# Patient Record
Sex: Female | Born: 1970 | Hispanic: No | Marital: Married | Smoking: Never smoker
Health system: Southern US, Community
[De-identification: ages and names within clinical notes are randomized; demographics above are authoritative.]

## PROBLEM LIST (undated history)

## (undated) DIAGNOSIS — N941 Unspecified dyspareunia: Secondary | ICD-10-CM

## (undated) DIAGNOSIS — N736 Female pelvic peritoneal adhesions (postinfective): Secondary | ICD-10-CM

## (undated) DIAGNOSIS — D649 Anemia, unspecified: Secondary | ICD-10-CM

## (undated) DIAGNOSIS — E669 Obesity, unspecified: Secondary | ICD-10-CM

## (undated) DIAGNOSIS — E28319 Asymptomatic premature menopause: Secondary | ICD-10-CM

## (undated) DIAGNOSIS — R112 Nausea with vomiting, unspecified: Secondary | ICD-10-CM

## (undated) DIAGNOSIS — D219 Benign neoplasm of connective and other soft tissue, unspecified: Secondary | ICD-10-CM

## (undated) DIAGNOSIS — Z90710 Acquired absence of both cervix and uterus: Secondary | ICD-10-CM

## (undated) DIAGNOSIS — Z90722 Acquired absence of ovaries, bilateral: Secondary | ICD-10-CM

## (undated) DIAGNOSIS — Z9079 Acquired absence of other genital organ(s): Secondary | ICD-10-CM

## (undated) DIAGNOSIS — R7309 Other abnormal glucose: Secondary | ICD-10-CM

## (undated) DIAGNOSIS — Z9889 Other specified postprocedural states: Secondary | ICD-10-CM

## (undated) DIAGNOSIS — N809 Endometriosis, unspecified: Secondary | ICD-10-CM

## (undated) HISTORY — DX: Anemia, unspecified: D64.9

## (undated) HISTORY — DX: Asymptomatic premature menopause: E28.319

## (undated) HISTORY — PX: DILATION AND CURETTAGE OF UTERUS: SHX78

## (undated) HISTORY — DX: Unspecified dyspareunia: N94.10

## (undated) HISTORY — PX: TONSILLECTOMY: SUR1361

## (undated) HISTORY — DX: Benign neoplasm of connective and other soft tissue, unspecified: D21.9

## (undated) HISTORY — DX: Acquired absence of other genital organ(s): Z90.722

## (undated) HISTORY — DX: Endometriosis, unspecified: N80.9

## (undated) HISTORY — DX: Acquired absence of ovaries, bilateral: Z90.710

## (undated) HISTORY — DX: Obesity, unspecified: E66.9

## (undated) HISTORY — PX: ABDOMINAL HYSTERECTOMY: SHX81

## (undated) HISTORY — DX: Acquired absence of other genital organ(s): Z90.79

## (undated) HISTORY — DX: Female pelvic peritoneal adhesions (postinfective): N73.6

## (undated) HISTORY — DX: Other abnormal glucose: R73.09

---

## 2004-08-10 ENCOUNTER — Ambulatory Visit: Payer: Self-pay | Admitting: Obstetrics and Gynecology

## 2004-10-05 ENCOUNTER — Ambulatory Visit: Payer: Self-pay | Admitting: Obstetrics and Gynecology

## 2004-11-23 ENCOUNTER — Inpatient Hospital Stay: Payer: Self-pay | Admitting: Obstetrics and Gynecology

## 2009-09-21 ENCOUNTER — Ambulatory Visit: Payer: Self-pay | Admitting: Obstetrics & Gynecology

## 2009-09-28 ENCOUNTER — Ambulatory Visit: Payer: Self-pay | Admitting: Obstetrics & Gynecology

## 2009-10-02 LAB — PATHOLOGY REPORT

## 2013-05-19 DIAGNOSIS — D649 Anemia, unspecified: Secondary | ICD-10-CM | POA: Insufficient documentation

## 2013-05-19 DIAGNOSIS — I10 Essential (primary) hypertension: Secondary | ICD-10-CM | POA: Insufficient documentation

## 2013-06-11 LAB — HM PAP SMEAR: HM Pap smear: NEGATIVE

## 2013-07-12 DIAGNOSIS — E669 Obesity, unspecified: Secondary | ICD-10-CM | POA: Insufficient documentation

## 2013-07-27 ENCOUNTER — Ambulatory Visit: Payer: Self-pay | Admitting: Obstetrics and Gynecology

## 2013-07-27 DIAGNOSIS — D500 Iron deficiency anemia secondary to blood loss (chronic): Secondary | ICD-10-CM

## 2013-07-27 LAB — BASIC METABOLIC PANEL
Anion Gap: 7 (ref 7–16)
BUN: 10 mg/dL (ref 7–18)
CALCIUM: 8.8 mg/dL (ref 8.5–10.1)
Chloride: 104 mmol/L (ref 98–107)
Co2: 28 mmol/L (ref 21–32)
Creatinine: 0.6 mg/dL (ref 0.60–1.30)
EGFR (African American): >60
EGFR (Non-African Amer.): >60
GLUCOSE: 92 mg/dL (ref 65–99)
Osmolality: 276 (ref 275–301)
POTASSIUM: 4.1 mmol/L (ref 3.5–5.1)
SODIUM: 139 mmol/L (ref 136–145)

## 2013-07-27 LAB — CBC
HCT: 37.2 % (ref 35.0–47.0)
HGB: 11.8 g/dL — AB (ref 12.0–16.0)
MCH: 25.3 pg — AB (ref 26.0–34.0)
MCHC: 31.7 g/dL — ABNORMAL LOW (ref 32.0–36.0)
MCV: 80 fL (ref 80–100)
Platelet: 335 10*3/uL (ref 150–440)
RBC: 4.66 10*6/uL (ref 3.80–5.20)
RDW: 17.6 % — ABNORMAL HIGH (ref 11.5–14.5)
WBC: 5.9 10*3/uL (ref 3.6–11.0)

## 2013-08-02 ENCOUNTER — Inpatient Hospital Stay: Payer: Self-pay | Admitting: Obstetrics and Gynecology

## 2013-08-03 LAB — CREATININE, SERUM
CREATININE: 0.76 mg/dL (ref 0.60–1.30)
EGFR (African American): >60
EGFR (Non-African Amer.): >60

## 2013-08-03 LAB — HEMOGLOBIN: HGB: 9.2 g/dL — AB (ref 12.0–16.0)

## 2013-08-05 LAB — PATHOLOGY REPORT

## 2014-04-30 NOTE — Op Note (Signed)
PATIENT NAME:  Melinda Flores, Melinda Flores MR#:  [id number] DATE OF BIRTH:  [birth year]  DATE OF PROCEDURE:  08/02/2013  PREOPERATIVE DIAGNOSES:  1.  Menorrhagia to anemia.  2.  Uterine fibroids.  3.  Severe dysmenorrhea.  4.  Right adnexal mass.   POSTOPERATIVE DIAGNOSES:  1.  Menorrhagia to anemia.  2.  Uterine fibroids.  3.  Severe dysmenorrhea.  4.  Right ovarian endometrioma.  5.  Pelvic adhesive disease.   OPERATIVE PROCEDURE: Total abdominal hysterectomy, bilateral salpingo-oophorectomy.   SURGEON: Brayton Mars, M.D.   FIRST ASSISTANT: Dr. Marcelline Mates.   ANESTHESIA: General endotracheal.   INDICATIONS: The patient is a 44 year old married white female, para 1-0-0-1, status post cesarean section in the past, who presents for definitive surgical management of chronic pelvic pain and menorrhagia. Preoperative ultrasound demonstrated a right adnexal mass.   FINDINGS AT SURGERY: Dense pelvic adhesions. The cul-de-sac was obliterated. The adnexa were stuck to the right and left pelvic sidewalls with dense adhesions that had to be lysed. The ureters were noted to be functional and away from the operative field. There was a right ovarian endometrioma which contained chocolate cyst fluid.   DESCRIPTION OF PROCEDURE: The patient was brought to the operating room where she was placed in the supine position. General endotracheal anesthesia was induced without difficulty. A ChloraPrep and Betadine abdominal, perineal, intravaginal prep and drape was performed in standard fashion. A Foley catheter was placed to drain clear yellow urine from the bladder.   A Pfannenstiel incision was made in the abdomen. The fascia was incised laterally with Mayo scissors. The midline raphe was identified, separated and the peritoneum was entered. The Balfour retractor was placed to facilitate exposure. Pelvic adhesions were taken down with both sharp and blunt dissection. Bovie cautery was also used to take down the  adhesions. Once anatomy was reasonably normalized, the bowel was packed off with laps. Hysterectomy was then performed in a standard fashion. The ovaries and tubes were left in place because of significant adhesive disease and these where ultimately taken out after the hysterectomy.   The round ligaments were doubly clamped, cut, and stick-tied using 0-Vicryl suture. The anterior and posterior leaves of the broad ligament were opened. The uteroovarian ligaments were doubly clamped, cut, and stick-tied bilaterally. The bladder flap was created through sharp dissection. The uterine vessels were skeletonized and ultimately doubly clamped, cut, and stick-tied using 0-Vicryl suture. The cul-de-sac adhesions were taken down gradually as the hysterectomy progressed. Sequentially, the cardinal/broad ligament complexes were clamped, cut, and stick-tied using 0-Vicryl suture. This was carried out down to the level of the cervicovaginal junction.  At this point, the cervix was crossclamped and the specimen was removed from the operative field. The angles of the vagina were closed with 0-Vicryl using a Richardson technique. The vaginal cuff was closed using figure-of-eight sutures of 2-0 Vicryl.   Next, attention was turned to the adnexa bilaterally. The adhesions were taken down. The ureters were identified and were maintained away from the operative field. The infundibulopelvic ligaments were doubly clamped and cut. Suture ligatures were used for hemostasis. This was done bilaterally. Once the tubes and ovaries were removed the pelvis was copiously irrigated with saline. This irrigant fluid was aspirated. Hemostasis was checked and noted to be intact.   The abdomen was then closed in layers, following removal of all instruments and laps. The fascia was closed with 0-Maxon in a simple running manner. Subcutaneous tissues not reapproximated. The skin was closed with a 4-0  Vicryl suture on a Keith needle. Dermabond was  placed over the incision.  A Lidoderm patch was applied. The patient was then awakened, extubated and taken to the recovery room in satisfactory condition.   ESTIMATED BLOOD LOSS: 300 mL.  IV FLUIDS:  700 mL of crystalloid.   URINE OUTPUT:  600 mL and clear.   All instruments, needle, and sponge counts were verified as correct. The patient received Ancef antibiotic prophylaxis.    ____________________________ Alanda Slim Terry Bolotin, MD mad:lt D: 08/02/2013 09:54:21 ET T: 08/02/2013 10:18:40 ET JOB#: [id number]  cc: Hassell Done A. Maurisha Mongeau, MD, <Dictator> [facility] Alanda Slim Gredmarie Delange MD ELECTRONICALLY SIGNED 08/02/2013 10:54

## 2014-06-15 ENCOUNTER — Encounter: Payer: Self-pay | Admitting: Obstetrics and Gynecology

## 2014-08-25 ENCOUNTER — Encounter: Payer: Self-pay | Admitting: Obstetrics and Gynecology

## 2014-10-17 ENCOUNTER — Other Ambulatory Visit: Payer: Self-pay | Admitting: Obstetrics and Gynecology

## 2014-10-26 ENCOUNTER — Ambulatory Visit (INDEPENDENT_AMBULATORY_CARE_PROVIDER_SITE_OTHER): Payer: Managed Care, Other (non HMO) | Admitting: Obstetrics and Gynecology

## 2014-10-26 ENCOUNTER — Encounter: Payer: Self-pay | Admitting: Obstetrics and Gynecology

## 2014-10-26 VITALS — BP 155/90 | HR 74 | Ht 61.0 in | Wt 186.6 lb

## 2014-10-26 DIAGNOSIS — Z01419 Encounter for gynecological examination (general) (routine) without abnormal findings: Secondary | ICD-10-CM

## 2014-10-26 DIAGNOSIS — F419 Anxiety disorder, unspecified: Secondary | ICD-10-CM | POA: Insufficient documentation

## 2014-10-26 DIAGNOSIS — N8003 Adenomyosis of the uterus: Secondary | ICD-10-CM | POA: Insufficient documentation

## 2014-10-26 DIAGNOSIS — E8940 Asymptomatic postprocedural ovarian failure: Secondary | ICD-10-CM | POA: Insufficient documentation

## 2014-10-26 DIAGNOSIS — N958 Other specified menopausal and perimenopausal disorders: Secondary | ICD-10-CM | POA: Diagnosis not present

## 2014-10-26 DIAGNOSIS — N809 Endometriosis, unspecified: Secondary | ICD-10-CM

## 2014-10-26 DIAGNOSIS — N800 Endometriosis of uterus: Secondary | ICD-10-CM | POA: Diagnosis not present

## 2014-10-26 DIAGNOSIS — Z1231 Encounter for screening mammogram for malignant neoplasm of breast: Secondary | ICD-10-CM

## 2014-10-26 MED ORDER — SERTRALINE HCL 50 MG PO TABS: 50.0000 mg | ORAL_TABLET | Freq: Every day | ORAL | Status: DC

## 2014-10-26 MED ORDER — ESTROGENS CONJUGATED 0.9 MG PO TABS: 0.9000 mg | ORAL_TABLET | Freq: Every day | ORAL | Status: DC

## 2014-10-26 NOTE — Patient Instructions (Signed)
1.  Next Pap smear is needed in 2018 2.  Mammogram scheduled. 3.  Increase Premarin to 0.9 mg daily 4.  Continue with calcium and vitamin D supplementation. 5.  Begin Zoloft 50 mg a day (one half tablet daily for first week). 6.  Return in 6 weeks for follow-up on increased estrogen dose and Zoloft. 7.  Return in 6 months for follow-up. 8.  Return in one year for annual exam

## 2014-10-26 NOTE — Progress Notes (Signed)
Patient ID: Antony Blackbird, female   DOB: February 15, 1970, 44 y.o.   MRN: [id number] ANNUAL PREVENTATIVE CARE GYN  ENCOUNTER NOTE  Subjective:       ANISAH KUCK is a 44 y.o. No obstetric history on file. female here for a routine annual gynecologic exam.  Current complaints: 1.  Anxious and stress  2.  Surgical menopause; status post TAH/BSO. 3.  History of endometriosis   Gynecologic History No LMP recorded. Patient has had a hysterectomy.TAH/BSO Contraception: status post hysterectomy Last Pap: 06/11/2013 neg/neg. Results were: normal Last mammogram: never. Results were: n/a  Obstetric History OB History  No data available    Past Medical History  Diagnosis Date  . S/P total abdominal hysterectomy and bilateral salpingo-oophorectomy   . Endometriosis   . Elevated glucose   . Obesity (BMI 35.0-39.9 without comorbidity) ([place])   . Premature menopause   . Dyspareunia, female   . Fibroids   . Pelvic adhesive disease   . Anemia     Past Surgical History  Procedure Laterality Date  . Abdominal hysterectomy    . Tonsillectomy      Current Outpatient Prescriptions on File Prior to Visit  Medication Sig Dispense Refill  . ibuprofen (ADVIL,MOTRIN) 800 MG tablet Take 800 mg by mouth every 8 (eight) hours as needed for moderate pain.    Marland Kitchen PREMARIN 0.625 MG tablet TAKE ONE TABLET EVERY DAY 30 tablet 0  . Vitamin D, Ergocalciferol, (DRISDOL) 50000 UNITS CAPS capsule Take 50,000 Units by mouth every 7 (seven) days.     No current facility-administered medications on file prior to visit.    Allergies  Allergen Reactions  . Codeine     Social History   Social History  . Marital Status: Married    Spouse Name: N/A  . Number of Children: N/A  . Years of Education: N/A   Occupational History  . Not on file.   Social History Main Topics  . Smoking status: Never Smoker   . Smokeless tobacco: Not on file  . Alcohol Use: Yes     Comment: occas  . Drug Use: No  . Sexual  Activity: Yes    Birth Control/ Protection: Surgical   Other Topics Concern  . Not on file   Social History Narrative    Family History  Problem Relation Age of Onset  . Diabetes Paternal Grandmother   . Heart disease Paternal Grandmother   . Cancer Neg Hx     The following portions of the patient's history were reviewed and updated as appropriate: allergies, current medications, past family history, past medical history, past social history, past surgical history and problem list.  Review of Systems ROS Review of Systems - General ROS: negative for - chills, fatigue, fever, hot flashes, night sweats, weight gain or weight loss Psychological ROS: negative for - anxiety, decreased libido, depression, mood swings, physical abuse or sexual abuse Ophthalmic ROS: negative for - blurry vision, eye pain or loss of vision ENT ROS: negative for - headaches, hearing change, visual changes or vocal changes Allergy and Immunology ROS: negative for - hives, itchy/watery eyes or seasonal allergies Hematological and Lymphatic ROS: negative for - bleeding problems, bruising, swollen lymph nodes or weight loss Endocrine ROS: negative for - galactorrhea, hair pattern changes, hot flashes, malaise/lethargy, mood swings, palpitations, polydipsia/polyuria, skin changes, temperature intolerance or unexpected weight changes Breast ROS: negative for - new or changing breast lumps or nipple discharge Respiratory ROS: negative for - cough or shortness of  breath Cardiovascular ROS: negative for - chest pain, irregular heartbeat, palpitations or shortness of breath Gastrointestinal ROS: no abdominal pain, change in bowel habits, or black or bloody stools Genito-Urinary ROS: no dysuria, trouble voiding, or hematuria Musculoskeletal ROS: negative for - joint pain or joint stiffness Neurological ROS: negative for - bowel and bladder control changes Dermatological ROS: negative for rash and skin lesion changes    Objective:   BP 155/90 mmHg  Pulse 74  Ht 5\' 1"  (1.549 m)  Wt 186 lb 9.6 oz (84.641 kg)  BMI 35.28 kg/m2 CONSTITUTIONAL: Well-developed, well-nourished female in no acute distress.  PSYCHIATRIC: Normal mood and affect. Normal behavior. Normal judgment and thought content. [place]: Alert and oriented to person, place, and time. Normal muscle tone coordination. No cranial nerve deficit noted. HENT:  Normocephalic, atraumatic, External right and left ear normal. Oropharynx is clear and moist EYES: Conjunctivae and EOM are normal. Pupils are equal, round, and reactive to light. No scleral icterus.  NECK: Normal range of motion, supple, no masses.  Normal thyroid.  SKIN: Skin is warm and dry. No rash noted. Not diaphoretic. No erythema. No pallor. CARDIOVASCULAR: Normal heart rate noted, regular rhythm, no murmur. RESPIRATORY: Clear to auscultation bilaterally. Effort and breath sounds normal, no problems with respiration noted. BREASTS: Symmetric in size. No masses, skin changes, nipple drainage, or lymphadenopathy. ABDOMEN: Soft, normal bowel sounds, no distention noted.  No tenderness, rebound or guarding.  BLADDER: Normal PELVIC:  External Genitalia: Normal  BUS: Normal  Vagina: Normal  Cervix: surgically absent  Uterus: surgically absent  Adnexa: Normal  RV: External Exam NormaI, No Rectal Masses and Normal Sphincter tone  MUSCULOSKELETAL: Normal range of motion. No tenderness.  No cyanosis, clubbing, or edema.  2+ distal pulses. LYMPHATIC: No Axillary, Supraclavicular, or Inguinal Adenopathy.    Assessment:   Annual gynecologic examination 44 y.o. Contraception: status post hysterectomy  Surgical menopause, mildly symptomatic Obesity 1  Anxiety; in counseling;marital stressors; anxiety stressors at work (hospitalist)  Problem List Items Addressed This Visit    None      Plan:  Pap: Not needed Mammogram: Ordered Stool Guaiac Testing:  Not Indicated Labs: lipid vit  d fbs a1c cmp tsh, Lipid 1, FBS, TSH, Hemoglobin A1C and Vit D Level""done Routine preventative health maintenance measures emphasized: Exercise/Diet/Weight control, Tobacco Warnings, Alcohol/Substance use risks and Stress Management  Zoloft 50mg , half pill x 1 week then full pill Premarin is increased to 0.9 mg daily Return in 6 weeks for follow-up. Return in 6 months for follow-up. Return to [place], [place]  Brayton Mars, MD  Note: This dictation was prepared with Dragon dictation along with smaller phrase technology. Any transcriptional errors that result from this process are unintentional.

## 2014-10-27 LAB — HEMOGLOBIN A1C
ESTIMATED AVERAGE GLUCOSE: 111 mg/dL
Hgb A1c MFr Bld: 5.5 % (ref 4.8–5.6)

## 2014-10-27 LAB — COMPREHENSIVE METABOLIC PANEL
ALBUMIN: 4.4 g/dL (ref 3.5–5.5)
ALT: 21 IU/L (ref 0–32)
AST: 19 IU/L (ref 0–40)
Albumin/Globulin Ratio: 1.6 (ref 1.1–2.5)
Alkaline Phosphatase: 71 IU/L (ref 39–117)
BUN / CREAT RATIO: 15 (ref 9–23)
BUN: 10 mg/dL (ref 6–24)
Bilirubin Total: 0.5 mg/dL (ref 0.0–1.2)
CALCIUM: 9.4 mg/dL (ref 8.7–10.2)
CO2: 25 mmol/L (ref 18–29)
CREATININE: 0.68 mg/dL (ref 0.57–1.00)
Chloride: 98 mmol/L (ref 97–106)
GFR calc non Af Amer: 107 mL/min/{1.73_m2} (ref >59)
GFR, EST AFRICAN AMERICAN: 124 mL/min/{1.73_m2} (ref >59)
GLOBULIN, TOTAL: 2.7 g/dL (ref 1.5–4.5)
Glucose: 94 mg/dL (ref 65–99)
Potassium: 3.9 mmol/L (ref 3.5–5.2)
Sodium: 139 mmol/L (ref 136–144)
Total Protein: 7.1 g/dL (ref 6.0–8.5)

## 2014-10-27 LAB — VITAMIN D 25 HYDROXY (VIT D DEFICIENCY, FRACTURES): Vit D, 25-Hydroxy: 41.1 ng/mL (ref 30.0–100.0)

## 2014-10-27 LAB — LIPID PANEL
Chol/HDL Ratio: 2.4 ratio units (ref 0.0–4.4)
Cholesterol, Total: 182 mg/dL (ref 100–199)
HDL: 75 mg/dL (ref >39)
LDL Calculated: 91 mg/dL (ref 0–99)
TRIGLYCERIDES: 78 mg/dL (ref 0–149)
VLDL Cholesterol Cal: 16 mg/dL (ref 5–40)

## 2014-10-27 LAB — TSH: TSH: 2.51 u[IU]/mL (ref 0.450–4.500)

## 2014-10-31 ENCOUNTER — Telehealth: Payer: Self-pay

## 2014-10-31 MED ORDER — IBUPROFEN 800 MG PO TABS: 800.0000 mg | ORAL_TABLET | Freq: Three times a day (TID) | ORAL | Status: DC | PRN

## 2014-10-31 NOTE — Telephone Encounter (Signed)
-----   Message from Brayton Mars, MD sent at 10/28/2014  1:08 PM EDT ----- Please Notify - Labs normal

## 2014-10-31 NOTE — Telephone Encounter (Signed)
Pt aware. Also erx ibup at pts request.

## 2014-11-04 ENCOUNTER — Ambulatory Visit
Admission: RE | Admit: 2014-11-04 | Discharge: 2014-11-04 | Disposition: A | Discharge status: Home / Self Care | Payer: Managed Care, Other (non HMO) | Source: Ambulatory Visit | Attending: Obstetrics and Gynecology | Admitting: Obstetrics and Gynecology

## 2014-11-04 DIAGNOSIS — Z1231 Encounter for screening mammogram for malignant neoplasm of breast: Secondary | ICD-10-CM | POA: Diagnosis present

## 2014-11-30 ENCOUNTER — Other Ambulatory Visit: Payer: Self-pay

## 2014-11-30 MED ORDER — VITAMIN D (ERGOCALCIFEROL) 1.25 MG (50000 UNIT) PO CAPS: 50000.0000 [IU] | ORAL_CAPSULE | ORAL | Status: DC

## 2014-12-20 ENCOUNTER — Encounter: Payer: Self-pay | Admitting: Obstetrics and Gynecology

## 2014-12-20 ENCOUNTER — Ambulatory Visit (INDEPENDENT_AMBULATORY_CARE_PROVIDER_SITE_OTHER): Payer: Managed Care, Other (non HMO) | Admitting: Obstetrics and Gynecology

## 2014-12-20 VITALS — Ht 61.0 in | Wt 188.3 lb

## 2014-12-20 DIAGNOSIS — F419 Anxiety disorder, unspecified: Secondary | ICD-10-CM

## 2014-12-20 DIAGNOSIS — E8940 Asymptomatic postprocedural ovarian failure: Secondary | ICD-10-CM

## 2014-12-20 DIAGNOSIS — N958 Other specified menopausal and perimenopausal disorders: Secondary | ICD-10-CM

## 2014-12-20 NOTE — Progress Notes (Signed)
Chief: 1.  Surgical menopause. 2.  History of adenomyosis status post TAH/BSO. 3.  Anxiety/depression.  Patient presents for follow-up on above issues.  She is not having any significant abdominal pelvic pain. SURGICAL MENOPAUSE: Patient has had increase in Premarin from 0.625 to 0.9 mg daily.  Her symptoms have resolved.  She has not noted any vaginal atrophy symptoms or signs. Anxiety/depression: Patient has noted significant improvement in symptomatology on Zoloft 50 mg a day.  Early on she had some mild headaches and bowel irritability with stomach cramping.  These side effects have resolved.  She has not had to use any Xanax in the past 6 weeks.  ASSESSMENT: 1.  Surgical menopause, a symptomatically on increased dosage of Premarin 0.9 mg daily 2.  Anxiety/depression, regulated with Zoloft 50 mg a day without side effects.  PLAN: 1.  Continue with Premarin 0.9 mg daily 2.  Continue with Zoloft 50 mg daily 3.  Return in 5 months for follow-up. 4.  Lab work was reviewed and notable for normal lipid panel, CMP, vitamin D level, TSH. 5.  Patient will continue to work on weight loss through healthy eating and exercise. 6.  Patient is to have CBC drawn with her follow-up with Dr. Doy Hutching to assess for chronic anemia changes.  Multivitamin with iron, suggested  A total of 15 minutes were spent face-to-face with the patient during this encounter and over half of that time dealt with counseling and coordination of care.  Brayton Mars, MD  Note: This dictation was prepared with Dragon dictation along with smaller phrase technology. Any transcriptional errors that result from this process are unintentional.

## 2014-12-20 NOTE — Patient Instructions (Signed)
1.  Continue with Zoloft 50 mg a day. 2.  Continue with Premarin 0.9 mg a day. 3.  Consider multivitamin with iron daily. 4.  Continue with healthy eating and exercise, weight loss. 5.  Follow up in 5 months for reassessment of symptomatology. 6.  Recommend CBC to be obtained at follow-up with Dr. Doy Hutching in 3 months

## 2015-03-02 ENCOUNTER — Other Ambulatory Visit: Payer: Self-pay | Admitting: Obstetrics and Gynecology

## 2015-03-02 ENCOUNTER — Telehealth (INDEPENDENT_AMBULATORY_CARE_PROVIDER_SITE_OTHER): Payer: Managed Care, Other (non HMO) | Admitting: Obstetrics and Gynecology

## 2015-03-02 DIAGNOSIS — R319 Hematuria, unspecified: Secondary | ICD-10-CM

## 2015-03-02 DIAGNOSIS — R300 Dysuria: Secondary | ICD-10-CM

## 2015-03-02 LAB — POCT URINALYSIS DIPSTICK
Bilirubin, UA: NEGATIVE
Glucose, UA: NEGATIVE
KETONES UA: NEGATIVE
Nitrite, UA: POSITIVE
PH UA: 7
PROTEIN UA: NEGATIVE
SPEC GRAV UA: 1.01
UROBILINOGEN UA: NEGATIVE

## 2015-03-02 MED ORDER — NITROFURANTOIN MONOHYD MACRO 100 MG PO CAPS: 100.0000 mg | ORAL_CAPSULE | Freq: Two times a day (BID) | ORAL | Status: DC

## 2015-03-02 NOTE — Telephone Encounter (Signed)
Pt aware u/s pos for uti- erx macrobid. Pt advised to push fluids. May try azo to help with sx. If no better in 7 days she will contact office.

## 2015-03-02 NOTE — Telephone Encounter (Signed)
Left detailed message  That pt may drop off urine sample. Will do u/a and culture. RF aware.

## 2015-03-02 NOTE — Telephone Encounter (Signed)
Pt had hysterectomy 1 yr ago, pt has light pink discharge/ urgency urine, burning/ pressure/ she thinks she may have a UTI? Also said her and her husband had sex before this happened.

## 2015-03-04 LAB — URINE CULTURE

## 2015-03-20 ENCOUNTER — Telehealth: Payer: Self-pay | Admitting: Obstetrics and Gynecology

## 2015-03-20 NOTE — Telephone Encounter (Signed)
PT CALLED AND HER INSURANCE IS CHANGING AT WORK AND THE COST OF THE PREMARIN IS GOING TO BE HIGH WANTED TO KNOW IF THERE WAS SOMETHING ELSE LIKE THE PREMARIN THAT COULD BE CALLED IN, AND SHE WANTED A 90 DAYS SUPPLY BECAUSE IT IS CHEAPER THAT WAY.

## 2015-03-20 NOTE — Telephone Encounter (Signed)
Pt aware mad to receive this message tomorrow.

## 2015-04-05 MED ORDER — ESTRADIOL 0.5 MG PO TABS: 0.5000 mg | ORAL_TABLET | Freq: Every day | ORAL | Status: DC

## 2015-04-05 NOTE — Addendum Note (Signed)
Addended by: Elouise Munroe on: 04/05/2015 12:35 PM   Modules accepted: Orders

## 2015-04-05 NOTE — Telephone Encounter (Addendum)
PER MAD OK TO CHANGE PREMARIN TO ESTRADIOL 0.5 MG QD. 90 DAY SUPPLY SENT TO [facility] PT AWARE.

## 2015-04-25 ENCOUNTER — Ambulatory Visit: Payer: Managed Care, Other (non HMO) | Admitting: Obstetrics and Gynecology

## 2015-05-09 ENCOUNTER — Ambulatory Visit: Payer: Managed Care, Other (non HMO) | Admitting: Obstetrics and Gynecology

## 2015-09-18 ENCOUNTER — Other Ambulatory Visit: Payer: Self-pay | Admitting: Obstetrics and Gynecology

## 2015-10-06 ENCOUNTER — Other Ambulatory Visit: Payer: Self-pay | Admitting: Obstetrics and Gynecology

## 2015-10-26 ENCOUNTER — Ambulatory Visit (INDEPENDENT_AMBULATORY_CARE_PROVIDER_SITE_OTHER): Payer: Managed Care, Other (non HMO) | Admitting: Obstetrics and Gynecology

## 2015-10-26 ENCOUNTER — Encounter: Payer: Self-pay | Admitting: Obstetrics and Gynecology

## 2015-10-26 VITALS — BP 157/90 | HR 80 | Ht 61.0 in | Wt 206.0 lb

## 2015-10-26 DIAGNOSIS — Z1231 Encounter for screening mammogram for malignant neoplasm of breast: Secondary | ICD-10-CM

## 2015-10-26 DIAGNOSIS — Z01419 Encounter for gynecological examination (general) (routine) without abnormal findings: Secondary | ICD-10-CM | POA: Diagnosis not present

## 2015-10-26 DIAGNOSIS — R638 Other symptoms and signs concerning food and fluid intake: Secondary | ICD-10-CM

## 2015-10-26 DIAGNOSIS — E559 Vitamin D deficiency, unspecified: Secondary | ICD-10-CM | POA: Diagnosis not present

## 2015-10-26 DIAGNOSIS — F419 Anxiety disorder, unspecified: Secondary | ICD-10-CM | POA: Diagnosis not present

## 2015-10-26 DIAGNOSIS — E8940 Asymptomatic postprocedural ovarian failure: Secondary | ICD-10-CM

## 2015-10-26 LAB — POCT URINALYSIS DIPSTICK
Bilirubin, UA: NEGATIVE
Glucose, UA: NEGATIVE
KETONES UA: NEGATIVE
Leukocytes, UA: NEGATIVE
Nitrite, UA: NEGATIVE
Protein, UA: NEGATIVE
RBC UA: NEGATIVE
SPEC GRAV UA: 1.01
UROBILINOGEN UA: NEGATIVE
pH, UA: 7

## 2015-10-26 MED ORDER — SERTRALINE HCL 50 MG PO TABS: 75.0000 mg | ORAL_TABLET | Freq: Every day | ORAL | 3 refills | Status: DC

## 2015-10-26 MED ORDER — SERTRALINE HCL 50 MG PO TABS: 50.0000 mg | ORAL_TABLET | Freq: Every day | ORAL | 3 refills | Status: DC

## 2015-10-26 MED ORDER — IBUPROFEN 800 MG PO TABS: 800.0000 mg | ORAL_TABLET | Freq: Three times a day (TID) | ORAL | 1 refills | Status: DC | PRN

## 2015-10-26 MED ORDER — ESTRADIOL 1 MG PO TABS: 1.0000 mg | ORAL_TABLET | Freq: Every day | ORAL | 3 refills | Status: DC

## 2015-10-26 MED ORDER — ESTRADIOL 0.5 MG PO TABS: 0.5000 mg | ORAL_TABLET | Freq: Every day | ORAL | 3 refills | Status: DC

## 2015-10-26 NOTE — Patient Instructions (Signed)
1. No Pap smear 2. Mammogram 3. Estradiol was increased to 1 mg a day 4. Zoloft is increased to 75 mg a day 5. Continue with healthy eating and exercise with controlled weight loss 6. Encourage calcium with vitamin D supplementation daily 7. Screening labs are ordered 8. Return in 1 year 9. For Xanax and Adipex refills, I recommend returning to primary care

## 2015-10-26 NOTE — Progress Notes (Signed)
ANNUAL PREVENTATIVE CARE GYN  ENCOUNTER NOTE  Subjective:       Melinda Flores is a 45 y.o. No obstetric history on file. female here for a routine annual gynecologic exam.  Current complaints: 1.   Wants cbc, cmp per pcp   Wants xanax and adipex refill-   Gynecologic History No LMP recorded. Patient has had a hysterectomy. Contraception: status post hysterectomy- tah.bso Last Pap: 06/2013 neg/neg. Results were: normal Last mammogram: 10/2014 birad 1. Results were: normal  Obstetric History OB History  No data available    Past Medical History:  Diagnosis Date  . Anemia   . Dyspareunia, female   . Elevated glucose   . Endometriosis   . Fibroids   . Obesity (BMI 35.0-39.9 without comorbidity)   . Pelvic adhesive disease   . Premature menopause   . S/P total abdominal hysterectomy and bilateral salpingo-oophorectomy     Past Surgical History:  Procedure Laterality Date  . ABDOMINAL HYSTERECTOMY    . TONSILLECTOMY      Current Outpatient Prescriptions on File Prior to Visit  Medication Sig Dispense Refill  . ALPRAZolam (XANAX) 0.25 MG tablet TAKE ONE TABLET BY MOUTH 3 TIMES DAILY AS NEEDED FOR    . estradiol (ESTRACE) 0.5 MG tablet Take 1 tablet (0.5 mg total) by mouth daily. 90 tablet 1  . estrogens, conjugated, (PREMARIN) 0.9 MG tablet Take 1 tablet (0.9 mg total) by mouth daily. Take daily for 21 days then do not take for 7 days. 30 tablet 11  . ibuprofen (ADVIL,MOTRIN) 800 MG tablet Take 1 tablet (800 mg total) by mouth every 8 (eight) hours as needed for moderate pain. 30 tablet 6  . nitrofurantoin, macrocrystal-monohydrate, (MACROBID) 100 MG capsule Take 1 capsule (100 mg total) by mouth 2 (two) times daily. 14 capsule 0  . PREMARIN 0.625 MG tablet TAKE ONE TABLET EVERY DAY (Patient not taking: Reported on 12/20/2014) 30 tablet 0  . sertraline (ZOLOFT) 50 MG tablet Take 1 tablet (50 mg total) by mouth daily. 30 tablet 0  . Vitamin D, Ergocalciferol, (DRISDOL) 50000  UNITS CAPS capsule Take 1 capsule (50,000 Units total) by mouth every 7 (seven) days. 30 capsule 2   No current facility-administered medications on file prior to visit.     Allergies  Allergen Reactions  . Codeine     Social History   Social History  . Marital status: Married    Spouse name: N/A  . Number of children: N/A  . Years of education: N/A   Occupational History  . Not on file.   Social History Main Topics  . Smoking status: Never Smoker  . Smokeless tobacco: Not on file  . Alcohol use Yes     Comment: occas  . Drug use: No  . Sexual activity: Yes    Birth control/ protection: Surgical   Other Topics Concern  . Not on file   Social History Narrative  . No narrative on file    Family History  Problem Relation Age of Onset  . Diabetes Paternal Grandmother   . Heart disease Paternal Grandmother   . Cancer Neg Hx     The following portions of the patient's history were reviewed and updated as appropriate: allergies, current medications, past family history, past medical history, past social history, past surgical history and problem list.  Review of Systems ROS Review of Systems - General ROS: negative for - chills, fatigue, fever, hot flashes, night sweats, weight gain or weight loss Psychological  ROS: negative for - anxiety, decreased libido, depression, mood swings, physical abuse or sexual abuse Ophthalmic ROS: negative for - blurry vision, eye pain or loss of vision ENT ROS: negative for - headaches, hearing change, visual changes or vocal changes Allergy and Immunology ROS: negative for - hives, itchy/watery eyes or seasonal allergies Hematological and Lymphatic ROS: negative for - bleeding problems, bruising, swollen lymph nodes or weight loss Endocrine ROS: negative for - galactorrhea, hair pattern changes, hot flashes, malaise/lethargy, mood swings, palpitations, polydipsia/polyuria, skin changes, temperature intolerance or unexpected weight  changes Breast ROS: negative for - new or changing breast lumps or nipple discharge Respiratory ROS: negative for - cough or shortness of breath Cardiovascular ROS: negative for - chest pain, irregular heartbeat, palpitations or shortness of breath Gastrointestinal ROS: no abdominal pain, change in bowel habits, or black or bloody stools Genito-Urinary ROS: no dysuria, trouble voiding, or hematuria Musculoskeletal ROS: negative for - joint pain or joint stiffness Neurological ROS: negative for - bowel and bladder control changes Dermatological ROS: negative for rash and skin lesion changes   Objective:   BP (!) 157/90   Pulse 80   Ht 5\' 1"  (1.549 m)   Wt 206 lb (93.4 kg)   BMI 38.92 kg/m  CONSTITUTIONAL: Well-developed, well-nourished female in no acute distress.  PSYCHIATRIC: Normal mood and affect. Normal behavior. Normal judgment and thought content. [place]: Alert and oriented to person, place, and time. Normal muscle tone coordination. No cranial nerve deficit noted. HENT:  Normocephalic, atraumatic EYES: Conjunctivae and EOM are normal.. No scleral icterus.  NECK: Normal range of motion, supple, no masses.  Normal thyroid.  SKIN: Skin is warm and dry. No rash noted. Not diaphoretic. No erythema. No pallor. CARDIOVASCULAR: Normal heart rate noted, regular rhythm, no murmur. RESPIRATORY: Clear to auscultation bilaterally. Effort and breath sounds normal, no problems with respiration noted. BREASTS: Symmetric in size. No masses, skin changes, nipple drainage, or lymphadenopathy. ABDOMEN: Soft, normal bowel sounds, no distention noted.  No tenderness, rebound or guarding.  BLADDER: Normal PELVIC:  External Genitalia: Normal  BUS: Normal  Vagina: Normal  Cervix: Surgically absent  Uterus: Surgically absent  Adnexa: Normal  RV: External Exam NormaI, No Rectal Masses and Normal Sphincter tone  MUSCULOSKELETAL: Normal range of motion. No tenderness.  No cyanosis, clubbing, or  edema.  2+ distal pulses. LYMPHATIC: No Axillary, Supraclavicular, or Inguinal Adenopathy.    Assessment:   Annual gynecologic examination 46 y.o. Contraception: status post hysterectomy TAH/BSO [id number]  Problem List Items Addressed This Visit    Anxiety   Surgical menopause    Other Visit Diagnoses    Well woman exam with routine gynecological exam    -  Primary   Encounter for screening mammogram for breast cancer          Plan:  Pap: Not needed Mammogram: Ordered Stool Guaiac Testing:  Not Indicated Labs: ., Lipid 1, FBS, TSH, Hemoglobin A1C and Vit D Level"". cbc cmp Routine preventative health maintenance measures emphasized: Exercise/Diet/Weight control, Tobacco Warnings and Alcohol/Substance use risks Increase Estrace to 1 mg a day Increase Zoloft from 50-75 mg a day No refill on Xanax or Adipex Return to [place], [facility]  Brayton Mars, MD  Note: This dictation was prepared with Dragon dictation along with smaller phrase technology. Any transcriptional errors that result from this process are unintentional.

## 2015-10-27 LAB — CBC WITH DIFFERENTIAL/PLATELET
BASOS: 0 %
Basophils Absolute: 0 10*3/uL (ref 0.0–0.2)
EOS (ABSOLUTE): 0 10*3/uL (ref 0.0–0.4)
EOS: 0 %
HEMATOCRIT: 42.9 % (ref 34.0–46.6)
HEMOGLOBIN: 14.6 g/dL (ref 11.1–15.9)
IMMATURE GRANS (ABS): 0 10*3/uL (ref 0.0–0.1)
IMMATURE GRANULOCYTES: 1 %
LYMPHS: 33 %
Lymphocytes Absolute: 2 10*3/uL (ref 0.7–3.1)
MCH: 30 pg (ref 26.6–33.0)
MCHC: 34 g/dL (ref 31.5–35.7)
MCV: 88 fL (ref 79–97)
MONOCYTES: 7 %
Monocytes Absolute: 0.4 10*3/uL (ref 0.1–0.9)
NEUTROS PCT: 59 %
Neutrophils Absolute: 3.5 10*3/uL (ref 1.4–7.0)
Platelets: 246 10*3/uL (ref 150–379)
RBC: 4.86 x10E6/uL (ref 3.77–5.28)
RDW: 12.7 % (ref 12.3–15.4)
WBC: 6 10*3/uL (ref 3.4–10.8)

## 2015-10-27 LAB — LIPID PANEL
CHOL/HDL RATIO: 2.7 ratio (ref 0.0–4.4)
Cholesterol, Total: 172 mg/dL (ref 100–199)
HDL: 63 mg/dL (ref >39)
LDL Calculated: 90 mg/dL (ref 0–99)
TRIGLYCERIDES: 97 mg/dL (ref 0–149)
VLDL Cholesterol Cal: 19 mg/dL (ref 5–40)

## 2015-10-27 LAB — HEMOGLOBIN A1C
Est. average glucose Bld gHb Est-mCnc: 111 mg/dL
HEMOGLOBIN A1C: 5.5 % (ref 4.8–5.6)

## 2015-10-27 LAB — COMPREHENSIVE METABOLIC PANEL
A/G RATIO: 1.4 (ref 1.2–2.2)
ALK PHOS: 75 IU/L (ref 39–117)
ALT: 24 IU/L (ref 0–32)
AST: 19 IU/L (ref 0–40)
Albumin: 4.3 g/dL (ref 3.5–5.5)
BILIRUBIN TOTAL: 0.4 mg/dL (ref 0.0–1.2)
BUN/Creatinine Ratio: 25 — ABNORMAL HIGH (ref 9–23)
BUN: 14 mg/dL (ref 6–24)
CHLORIDE: 100 mmol/L (ref 96–106)
CO2: 22 mmol/L (ref 18–29)
Calcium: 9 mg/dL (ref 8.7–10.2)
Creatinine, Ser: 0.55 mg/dL — ABNORMAL LOW (ref 0.57–1.00)
GFR calc Af Amer: 132 mL/min/{1.73_m2} (ref >59)
GFR calc non Af Amer: 114 mL/min/{1.73_m2} (ref >59)
GLOBULIN, TOTAL: 3 g/dL (ref 1.5–4.5)
Glucose: 75 mg/dL (ref 65–99)
POTASSIUM: 4.2 mmol/L (ref 3.5–5.2)
SODIUM: 142 mmol/L (ref 134–144)
Total Protein: 7.3 g/dL (ref 6.0–8.5)

## 2015-10-27 LAB — VITAMIN D 25 HYDROXY (VIT D DEFICIENCY, FRACTURES): Vit D, 25-Hydroxy: 35.9 ng/mL (ref 30.0–100.0)

## 2015-10-27 LAB — TSH: TSH: 2.34 u[IU]/mL (ref 0.450–4.500)

## 2015-10-28 LAB — URINE CULTURE: ORGANISM ID, BACTERIA: NO GROWTH

## 2015-10-31 ENCOUNTER — Encounter: Payer: Managed Care, Other (non HMO) | Admitting: Obstetrics and Gynecology

## 2016-01-29 ENCOUNTER — Other Ambulatory Visit: Payer: Self-pay | Admitting: Obstetrics and Gynecology

## 2016-05-22 DIAGNOSIS — E6609 Other obesity due to excess calories: Secondary | ICD-10-CM | POA: Diagnosis not present

## 2016-05-22 DIAGNOSIS — F419 Anxiety disorder, unspecified: Secondary | ICD-10-CM | POA: Diagnosis not present

## 2016-05-22 DIAGNOSIS — I10 Essential (primary) hypertension: Secondary | ICD-10-CM | POA: Diagnosis not present

## 2016-05-22 DIAGNOSIS — Z79899 Other long term (current) drug therapy: Secondary | ICD-10-CM | POA: Diagnosis not present

## 2016-08-31 ENCOUNTER — Other Ambulatory Visit: Payer: Self-pay | Admitting: Obstetrics and Gynecology

## 2016-10-31 ENCOUNTER — Encounter: Payer: Managed Care, Other (non HMO) | Admitting: Obstetrics and Gynecology

## 2017-05-26 NOTE — Progress Notes (Signed)
ANNUAL PREVENTATIVE CARE GYN  ENCOUNTER NOTE  Subjective:       Melinda Flores is a 47 y.o. No obstetric history on file. female here for a routine annual gynecologic exam.  Current complaints: 1.   Vit d lab ordered  2. Wants your thoughts adipex  3. Sex is uncomfortable; continue ERT and addition of lubricants discussed  4. Other options for hrt - currently on estradiol 1mg ; therapy is recommended through age 72 5. 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  She feels more fulfilled with the additional structure at her new job.    Gynecologic History No LMP recorded. Patient has had a hysterectomy. Contraception: status post hysterectomy- tah.bso Last Pap: 06/2013 neg/neg. Last mammogram: 10/2014 birad 1  Obstetric History OB History  Gravida Para Term Preterm AB Living  3 1 1   2 1   SAB TAB Ectopic Multiple Live Births  2       1    # Outcome Date GA Lbr Len/2nd Weight Sex Delivery Anes PTL Lv  3 Term 2006   7 lb 14.4 oz (3.583 kg) F CS-LTranv   LIV  2 SAB           1 SAB             Past Medical History:  Diagnosis Date  . Anemia   . Dyspareunia, female   . Elevated glucose   . Endometriosis   . Fibroids   . Obesity (BMI 35.0-39.9 without comorbidity)   . Pelvic adhesive disease   . Premature menopause   . S/P total abdominal hysterectomy and bilateral salpingo-oophorectomy     Past Surgical History:  Procedure Laterality Date  . ABDOMINAL HYSTERECTOMY    . DILATION AND CURETTAGE OF UTERUS    . TONSILLECTOMY      Current Outpatient Medications on File Prior to Visit  Medication Sig Dispense Refill  . ALPRAZolam (XANAX) 0.25 MG tablet TAKE ONE TABLET BY MOUTH 3 TIMES DAILY AS NEEDED FOR    . estradiol (ESTRACE) 1 MG tablet TAKE ONE TABLET BY MOUTH EVERY DAY 90 tablet 0  . ibuprofen (ADVIL,MOTRIN) 800 MG tablet TAKE ONE TABLET BY MOUTH EVERY EIGHT  HOURS AS NEEDED FOR MODERATE PAIN 90 tablet 1  . sertraline (ZOLOFT) 50 MG tablet TAKE ONE AND A HALF TABLETS BY MOUTH EVERY DAY 135 tablet 0  . Vitamin D, Ergocalciferol, (DRISDOL) 50000 UNITS CAPS capsule Take 1 capsule (50,000 Units total) by mouth every 7 (seven) days. 30 capsule 2   No current facility-administered medications on file prior to visit.     Allergies  Allergen Reactions  . Codeine     Social History   Socioeconomic History  . Marital status: Married    Spouse name: Not on file  . Number of children: Not on file  . Years of education: Not on file  . Highest education level: Not on file  Occupational History  . Not on file  Social Needs  . Financial resource strain: Not on file  . Food insecurity:    Worry: Not on file    Inability: Not on file  . Transportation needs:    Medical: Not on file    Non-medical: Not on file  Tobacco Use  . Smoking status: Never Smoker  . Smokeless tobacco: Never Used  Substance and Sexual Activity  .  Alcohol use: Yes    Comment: occas  . Drug use: No  . Sexual activity: Yes    Birth control/protection: Surgical  Lifestyle  . Physical activity:    Days per week: Not on file    Minutes per session: Not on file  . Stress: Not on file  Relationships  . Social connections:    Talks on phone: Not on file    Gets together: Not on file    Attends religious service: Not on file    Active member of club or organization: Not on file    Attends meetings of clubs or organizations: Not on file    Relationship status: Not on file  . Intimate partner violence:    Fear of current or ex partner: Not on file    Emotionally abused: Not on file    Physically abused: Not on file    Forced sexual activity: Not on file  Other Topics Concern  . Not on file  Social History Narrative  . Not on file    Family History  Problem Relation Age of Onset  . Diabetes Paternal Grandmother   . Heart disease Paternal Grandmother   . Cancer  Neg Hx     The following portions of the patient's history were reviewed and updated as appropriate: allergies, current medications, past family history, past medical history, past social history, past surgical history and problem list.  Review of Systems Review of Systems  Constitutional: Negative.   HENT: Negative.   Eyes: Negative.   Respiratory: Negative.   Cardiovascular: Negative.   Gastrointestinal: Negative.   Genitourinary: Negative.   Musculoskeletal: Negative.   Skin: Negative.   Neurological: Negative.   Endo/Heme/Allergies: Negative.   Psychiatric/Behavioral: Negative.      Objective:   BP 133/80   Pulse 76   Ht 5\' 1"  (1.549 m)   Wt 217 lb 12.8 oz (98.8 kg)   BMI 41.15 kg/m  CONSTITUTIONAL: Well-developed, well-nourished female in no acute distress.  PSYCHIATRIC: Normal mood and affect. Normal behavior. Normal judgment and thought content. [place]: Alert and oriented to person, place, and time. Normal muscle tone coordination. No cranial nerve deficit noted. HENT:  Normocephalic, atraumatic EYES: Conjunctivae and EOM are normal.. No scleral icterus.  NECK: Normal range of motion, supple, no masses.  Normal thyroid.  SKIN: Skin is warm and dry. No rash noted. Not diaphoretic. No erythema. No pallor. CARDIOVASCULAR: Normal heart rate noted, regular rhythm, no murmur. RESPIRATORY: Clear to auscultation bilaterally. Effort and breath sounds normal, no problems with respiration noted. BREASTS: Symmetric in size. No masses, skin changes, nipple drainage, or lymphadenopathy. ABDOMEN: Soft,  no distention noted.  No tenderness, rebound or guarding.  BLADDER: Normal PELVIC:  External Genitalia: Normal  BUS: Normal  Vagina: Normal estrogen effect  Cervix: Surgically absent  Uterus: Surgically absent  Adnexa: Normal  RV: External Exam NormaI, No Rectal Masses and Normal Sphincter tone  MUSCULOSKELETAL: Normal range of motion. No tenderness.  No cyanosis, clubbing,  or edema.  2+ distal pulses. LYMPHATIC: No Axillary, Supraclavicular, or Inguinal Adenopathy.    Assessment:   Annual gynecologic examination 47 y.o. Contraception: status post hysterectomy TAH/BSO [id number]  Problem List Items Addressed This Visit    Anxiety   Surgical menopause    Other Visit Diagnoses    Well woman exam with routine gynecological exam    -  Primary   Encounter for screening mammogram for breast cancer       Increased BMI  Plan:  Pap: Not needed Mammogram: Ordered Stool Guaiac Testing:  Not Indicated Labs:  Vit d Routine preventative health maintenance measures emphasized: Exercise/Diet/Weight control, Tobacco Warnings and Alcohol/Substance use risks Continue Estrace  1 mg a day Return to [place], CMA  Brayton Mars, MD  Note: This dictation was prepared with Dragon dictation along with smaller phrase technology. Any transcriptional errors that result from this process are unintentional.

## 2017-05-28 ENCOUNTER — Encounter: Payer: Self-pay | Admitting: Obstetrics and Gynecology

## 2017-05-28 ENCOUNTER — Ambulatory Visit (INDEPENDENT_AMBULATORY_CARE_PROVIDER_SITE_OTHER): Payer: No Typology Code available for payment source | Admitting: Obstetrics and Gynecology

## 2017-05-28 VITALS — BP 133/80 | HR 76 | Ht 61.0 in | Wt 217.8 lb

## 2017-05-28 DIAGNOSIS — E8940 Asymptomatic postprocedural ovarian failure: Secondary | ICD-10-CM | POA: Diagnosis not present

## 2017-05-28 DIAGNOSIS — F419 Anxiety disorder, unspecified: Secondary | ICD-10-CM

## 2017-05-28 DIAGNOSIS — R638 Other symptoms and signs concerning food and fluid intake: Secondary | ICD-10-CM | POA: Diagnosis not present

## 2017-05-28 DIAGNOSIS — Z8639 Personal history of other endocrine, nutritional and metabolic disease: Secondary | ICD-10-CM | POA: Diagnosis not present

## 2017-05-28 DIAGNOSIS — Z1231 Encounter for screening mammogram for malignant neoplasm of breast: Secondary | ICD-10-CM | POA: Diagnosis not present

## 2017-05-28 DIAGNOSIS — Z01419 Encounter for gynecological examination (general) (routine) without abnormal findings: Secondary | ICD-10-CM

## 2017-05-28 MED ORDER — ESTRADIOL 1 MG PO TABS: 1.0000 mg | ORAL_TABLET | Freq: Every day | ORAL | 3 refills | Status: DC

## 2017-05-28 NOTE — Patient Instructions (Addendum)
1.  Pap smear is not done.  No further Paps are needed. 2.  Mammogram is ordered 3.  Screening labs are done through primary care.  Vitamin D level was ordered today. 4.  Continue with healthy eating, exercise, and control weight loss 5.  Recommend calcium 600 mg twice a day and vitamin D 400 international units twice a day 6.  Estradiol 1 mg daily is refilled 7.  Patient is on Pristiq prescribed by Dr. Doy Hutching, to be continued 8.  Return in 1 year for annual exam 9.  Over-the-counter lubricants include:  Virgin olive oil  Coconut oil  Astroglide  Jo H2O lubricant   Health Maintenance for Postmenopausal Women Menopause is a normal process in which your reproductive ability comes to an end. This process happens gradually over a span of months to years, usually between the ages of 61 and 25. Menopause is complete when you have missed 12 consecutive menstrual periods. It is important to talk with your health care provider about some of the most common conditions that affect postmenopausal women, such as heart disease, cancer, and bone loss (osteoporosis). Adopting a healthy lifestyle and getting preventive care can help to promote your health and wellness. Those actions can also lower your chances of developing some of these common conditions. What should I know about menopause? During menopause, you may experience a number of symptoms, such as:  Moderate-to-severe hot flashes.  Night sweats.  Decrease in sex drive.  Mood swings.  Headaches.  Tiredness.  Irritability.  Memory problems.  Insomnia.  Choosing to treat or not to treat menopausal changes is an individual decision that you make with your health care provider. What should I know about hormone replacement therapy and supplements? Hormone therapy products are effective for treating symptoms that are associated with menopause, such as hot flashes and night sweats. Hormone replacement carries certain risks, especially as  you become older. If you are thinking about using estrogen or estrogen with progestin treatments, discuss the benefits and risks with your health care provider. What should I know about heart disease and stroke? Heart disease, heart attack, and stroke become more likely as you age. This may be due, in part, to the hormonal changes that your body experiences during menopause. These can affect how your body processes dietary fats, triglycerides, and cholesterol. Heart attack and stroke are both medical emergencies. There are many things that you can do to help prevent heart disease and stroke:  Have your blood pressure checked at least every 1-2 years. High blood pressure causes heart disease and increases the risk of stroke.  If you are 49-85 years old, ask your health care provider if you should take aspirin to prevent a heart attack or a stroke.  Do not use any tobacco products, including cigarettes, chewing tobacco, or electronic cigarettes. If you need help quitting, ask your health care provider.  It is important to eat a healthy diet and maintain a healthy weight. ? Be sure to include plenty of vegetables, fruits, low-fat dairy products, and lean protein. ? Avoid eating foods that are high in solid fats, added sugars, or salt (sodium).  Get regular exercise. This is one of the most important things that you can do for your health. ? Try to exercise for at least 150 minutes each week. The type of exercise that you do should increase your heart rate and make you sweat. This is known as moderate-intensity exercise. ? Try to do strengthening exercises at least twice each  week. Do these in addition to the moderate-intensity exercise.  Know your numbers.Ask your health care provider to check your cholesterol and your blood glucose. Continue to have your blood tested as directed by your health care provider.  What should I know about cancer screening? There are several types of cancer. Take the  following steps to reduce your risk and to catch any cancer development as early as possible. Breast Cancer  Practice breast self-awareness. ? This means understanding how your breasts normally appear and feel. ? It also means doing regular breast self-exams. Let your health care provider know about any changes, no matter how small.  If you are 43 or older, have a clinician do a breast exam (clinical breast exam or CBE) every year. Depending on your age, family history, and medical history, it may be recommended that you also have a yearly breast X-ray (mammogram).  If you have a family history of breast cancer, talk with your health care provider about genetic screening.  If you are at high risk for breast cancer, talk with your health care provider about having an MRI and a mammogram every year.  Breast cancer (BRCA) gene test is recommended for women who have family members with BRCA-related cancers. Results of the assessment will determine the need for genetic counseling and BRCA1 and for BRCA2 testing. BRCA-related cancers include these types: ? Breast. This occurs in males or females. ? Ovarian. ? Tubal. This may also be called fallopian tube cancer. ? Cancer of the abdominal or pelvic lining (peritoneal cancer). ? Prostate. ? Pancreatic.  Cervical, Uterine, and Ovarian Cancer Your health care provider may recommend that you be screened regularly for cancer of the pelvic organs. These include your ovaries, uterus, and vagina. This screening involves a pelvic exam, which includes checking for microscopic changes to the surface of your cervix (Pap test).  For women ages 21-65, health care providers may recommend a pelvic exam and a Pap test every three years. For women ages 77-65, they may recommend the Pap test and pelvic exam, combined with testing for human papilloma virus (HPV), every five years. Some types of HPV increase your risk of cervical cancer. Testing for HPV may also be done  on women of any age who have unclear Pap test results.  Other health care providers may not recommend any screening for nonpregnant women who are considered low risk for pelvic cancer and have no symptoms. Ask your health care provider if a screening pelvic exam is right for you.  If you have had past treatment for cervical cancer or a condition that could lead to cancer, you need Pap tests and screening for cancer for at least 20 years after your treatment. If Pap tests have been discontinued for you, your risk factors (such as having a new sexual partner) need to be reassessed to determine if you should start having screenings again. Some women have medical problems that increase the chance of getting cervical cancer. In these cases, your health care provider may recommend that you have screening and Pap tests more often.  If you have a family history of uterine cancer or ovarian cancer, talk with your health care provider about genetic screening.  If you have vaginal bleeding after reaching menopause, tell your health care provider.  There are currently no reliable tests available to screen for ovarian cancer.  Lung Cancer Lung cancer screening is recommended for adults 67-68 years old who are at high risk for lung cancer because of a  history of smoking. A yearly low-dose CT scan of the lungs is recommended if you:  Currently smoke.  Have a history of at least 30 pack-years of smoking and you currently smoke or have quit within the past 15 years. A pack-year is smoking an average of one pack of cigarettes per day for one year.  Yearly screening should:  Continue until it has been 15 years since you quit.  Stop if you develop a health problem that would prevent you from having lung cancer treatment.  Colorectal Cancer  This type of cancer can be detected and can often be prevented.  Routine colorectal cancer screening usually begins at age 97 and continues through age 31.  If you  have risk factors for colon cancer, your health care provider may recommend that you be screened at an earlier age.  If you have a family history of colorectal cancer, talk with your health care provider about genetic screening.  Your health care provider may also recommend using home test kits to check for hidden blood in your stool.  A small camera at the end of a tube can be used to examine your colon directly (sigmoidoscopy or colonoscopy). This is done to check for the earliest forms of colorectal cancer.  Direct examination of the colon should be repeated every 5-10 years until age 78. However, if early forms of precancerous polyps or small growths are found or if you have a family history or genetic risk for colorectal cancer, you may need to be screened more often.  Skin Cancer  Check your skin from head to toe regularly.  Monitor any moles. Be sure to tell your health care provider: ? About any new moles or changes in moles, especially if there is a change in a mole's shape or color. ? If you have a mole that is larger than the size of a pencil eraser.  If any of your family members has a history of skin cancer, especially at a young age, talk with your health care provider about genetic screening.  Always use sunscreen. Apply sunscreen liberally and repeatedly throughout the day.  Whenever you are outside, protect yourself by wearing long sleeves, pants, a wide-brimmed hat, and sunglasses.  What should I know about osteoporosis? Osteoporosis is a condition in which bone destruction happens more quickly than new bone creation. After menopause, you may be at an increased risk for osteoporosis. To help prevent osteoporosis or the bone fractures that can happen because of osteoporosis, the following is recommended:  If you are 45-82 years old, get at least 1,000 mg of calcium and at least 600 mg of vitamin D per day.  If you are older than age 13 but younger than age 39, get at  least 1,200 mg of calcium and at least 600 mg of vitamin D per day.  If you are older than age 51, get at least 1,200 mg of calcium and at least 800 mg of vitamin D per day.  Smoking and excessive alcohol intake increase the risk of osteoporosis. Eat foods that are rich in calcium and vitamin D, and do weight-bearing exercises several times each week as directed by your health care provider. What should I know about how menopause affects my mental health? Depression may occur at any age, but it is more common as you become older. Common symptoms of depression include:  Low or sad mood.  Changes in sleep patterns.  Changes in appetite or eating patterns.  Feeling an overall  lack of motivation or enjoyment of activities that you previously enjoyed.  Frequent crying spells.  Talk with your health care provider if you think that you are experiencing depression. What should I know about immunizations? It is important that you get and maintain your immunizations. These include:  Tetanus, diphtheria, and pertussis (Tdap) booster vaccine.  Influenza every year before the flu season begins.  Pneumonia vaccine.  Shingles vaccine.  Your health care provider may also recommend other immunizations. This information is not intended to replace advice given to you by your health care provider. Make sure you discuss any questions you have with your health care provider. Document Released: 02/15/2005 Document Revised: 07/14/2015 Document Reviewed: 09/27/2014 Elsevier Interactive Patient Education  2018 [employer]  

## 2017-05-28 NOTE — Addendum Note (Signed)
Addended by: Elouise Munroe on: 05/28/2017 02:30 PM   Modules accepted: Orders

## 2019-05-11 ENCOUNTER — Other Ambulatory Visit: Payer: Self-pay | Admitting: Internal Medicine

## 2019-05-11 DIAGNOSIS — Z1231 Encounter for screening mammogram for malignant neoplasm of breast: Secondary | ICD-10-CM

## 2019-10-10 ENCOUNTER — Other Ambulatory Visit: Payer: Self-pay | Admitting: Internal Medicine

## 2019-10-26 ENCOUNTER — Other Ambulatory Visit: Payer: Self-pay | Admitting: Internal Medicine

## 2019-11-23 ENCOUNTER — Other Ambulatory Visit: Payer: Self-pay | Admitting: Internal Medicine

## 2019-11-25 ENCOUNTER — Other Ambulatory Visit: Payer: Self-pay | Admitting: Internal Medicine

## 2020-03-03 ENCOUNTER — Other Ambulatory Visit: Payer: Self-pay | Admitting: Internal Medicine

## 2020-06-29 ENCOUNTER — Other Ambulatory Visit: Payer: Self-pay

## 2020-06-29 MED FILL — Ergocalciferol Cap 1.25 MG (50000 Unit): ORAL | 84 days supply | Qty: 12 | Fill #0 | Status: AC

## 2020-06-29 MED FILL — Hydrochlorothiazide Tab 25 MG: ORAL | 90 days supply | Qty: 90 | Fill #0 | Status: AC

## 2020-06-29 MED FILL — Phentermine HCl Tab 37.5 MG: ORAL | 90 days supply | Qty: 90 | Fill #0 | Status: AC

## 2020-06-29 MED FILL — Desvenlafaxine Succinate Tab ER 24HR 50 MG (Base Equiv): ORAL | 90 days supply | Qty: 90 | Fill #0 | Status: AC

## 2020-06-29 MED FILL — Estradiol Tab 1 MG: ORAL | 90 days supply | Qty: 90 | Fill #0 | Status: AC

## 2020-07-03 ENCOUNTER — Other Ambulatory Visit: Payer: Self-pay

## 2020-07-03 MED ORDER — PHENTERMINE HCL 37.5 MG PO TABS
ORAL_TABLET | ORAL | 1 refills | Status: DC
  Filled 2020-07-03: qty 90, 90d supply, fill #0

## 2020-07-03 MED ORDER — METFORMIN HCL 500 MG PO TABS
ORAL_TABLET | ORAL | 3 refills | Status: DC
  Filled 2020-07-03: qty 180, 90d supply, fill #0
  Filled 2020-10-06: qty 180, 90d supply, fill #1

## 2020-07-05 ENCOUNTER — Other Ambulatory Visit: Payer: Self-pay | Admitting: Internal Medicine

## 2020-07-05 DIAGNOSIS — Z1231 Encounter for screening mammogram for malignant neoplasm of breast: Secondary | ICD-10-CM

## 2020-10-06 ENCOUNTER — Other Ambulatory Visit: Payer: Self-pay

## 2020-10-06 MED ORDER — PHENTERMINE HCL 37.5 MG PO TABS
ORAL_TABLET | ORAL | 1 refills | Status: DC
  Filled 2020-10-06: qty 90, 90d supply, fill #0
  Filled 2021-01-02: qty 90, 90d supply, fill #1

## 2020-10-09 ENCOUNTER — Other Ambulatory Visit: Payer: Self-pay | Admitting: Internal Medicine

## 2020-10-09 ENCOUNTER — Other Ambulatory Visit: Payer: Self-pay

## 2020-10-09 DIAGNOSIS — Z1231 Encounter for screening mammogram for malignant neoplasm of breast: Secondary | ICD-10-CM

## 2020-10-09 MED ORDER — DESVENLAFAXINE SUCCINATE ER 50 MG PO TB24
50.0000 mg | ORAL_TABLET | Freq: Every day | ORAL | 3 refills | Status: DC
  Filled 2020-10-09: qty 90, 90d supply, fill #0
  Filled 2021-01-02: qty 90, 90d supply, fill #1
  Filled 2021-03-21: qty 90, 90d supply, fill #2
  Filled 2021-08-26: qty 90, 90d supply, fill #3

## 2020-10-09 MED ORDER — ESTRADIOL 1 MG PO TABS
ORAL_TABLET | ORAL | 3 refills | Status: DC
  Filled 2020-10-09: qty 90, 90d supply, fill #0
  Filled 2020-12-19: qty 90, 90d supply, fill #1
  Filled 2021-03-21: qty 90, 90d supply, fill #2
  Filled 2021-08-26: qty 90, 90d supply, fill #3

## 2020-10-09 MED ORDER — ALPRAZOLAM 0.25 MG PO TABS
ORAL_TABLET | ORAL | 0 refills | Status: AC
  Filled 2020-10-09: qty 270, 90d supply, fill #0

## 2020-10-09 MED ORDER — VITAMIN D (ERGOCALCIFEROL) 1.25 MG (50000 UNIT) PO CAPS
ORAL_CAPSULE | ORAL | 3 refills | Status: DC
  Filled 2020-10-09: qty 12, 84d supply, fill #0
  Filled 2021-08-26: qty 12, 84d supply, fill #1

## 2020-10-09 MED ORDER — HYDROCHLOROTHIAZIDE 25 MG PO TABS
25.0000 mg | ORAL_TABLET | Freq: Every day | ORAL | 3 refills | Status: AC
  Filled 2020-10-09: qty 90, 90d supply, fill #0
  Filled 2021-01-02: qty 90, 90d supply, fill #1
  Filled 2021-03-21: qty 90, 90d supply, fill #2

## 2020-10-17 ENCOUNTER — Ambulatory Visit
Admission: RE | Admit: 2020-10-17 | Discharge: 2020-10-17 | Disposition: A | Discharge status: Home / Self Care | Payer: No Typology Code available for payment source | Source: Ambulatory Visit | Attending: Internal Medicine | Admitting: Internal Medicine

## 2020-10-17 ENCOUNTER — Other Ambulatory Visit: Payer: Self-pay

## 2020-10-17 DIAGNOSIS — Z1231 Encounter for screening mammogram for malignant neoplasm of breast: Secondary | ICD-10-CM | POA: Diagnosis not present

## 2020-10-18 ENCOUNTER — Other Ambulatory Visit: Payer: Self-pay | Admitting: Internal Medicine

## 2020-10-18 DIAGNOSIS — R1031 Right lower quadrant pain: Secondary | ICD-10-CM

## 2020-11-06 ENCOUNTER — Ambulatory Visit
Admission: RE | Admit: 2020-11-06 | Discharge: 2020-11-06 | Disposition: A | Discharge status: Home / Self Care | Payer: No Typology Code available for payment source | Source: Ambulatory Visit | Attending: Internal Medicine | Admitting: Internal Medicine

## 2020-11-06 ENCOUNTER — Other Ambulatory Visit: Payer: Self-pay

## 2020-11-06 DIAGNOSIS — R1031 Right lower quadrant pain: Secondary | ICD-10-CM | POA: Diagnosis present

## 2020-11-06 MED ORDER — IOHEXOL 350 MG/ML SOLN
100.0000 mL | Freq: Once | INTRAVENOUS | Status: AC | PRN
  Administered 2020-11-06: 100 mL via INTRAVENOUS

## 2020-11-13 ENCOUNTER — Other Ambulatory Visit: Payer: Self-pay | Admitting: General Surgery

## 2020-11-13 NOTE — Progress Notes (Signed)
Subjective:     Patient ID: Melinda Flores is a 50 y.o. female.   HPI   The following portions of the patient's history were reviewed and updated as appropriate.   This a new patient is here today for: office visit. Here today for evaluation of inguinal hernia referred by Dr Doy Hutching.  The patient reported during a recent scheduled exam that she had had intermittent discomfort in the right groin with rare episodes on the left.  This was most pronounced when she had had worked 3-12-hour shifts in a row.  These episodes were not associated with nausea, vomiting or change in bowel habits.  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   She reports that she has some discomfort when having sexual intercourse or doing excessive activities.           Review of Systems  Constitutional: Negative for chills and fever.  Respiratory: Negative for cough.   Gastrointestinal: Negative.   Genitourinary: Positive for dyspareunia (one episode with vaginal spotting 05/2020, no recurrence. ). Negative for difficulty urinating and menstrual problem.         Chief Complaint  Patient presents with   Hernia      BP (!) 162/90   Pulse 108   Temp 36.6 C (97.8 F)   Ht 154.9 cm (5' 0.98")   Wt 97.5 kg (215 lb)   LMP 05/14/2013   SpO2 97%   BMI 40.65 kg/m        Past Medical History:  Diagnosis Date   Anemia     Anxiety     Chickenpox     Endometriosis     Fibroid     Hypertension     Obesity     Pelvic adhesive disease             Past Surgical History:  Procedure Laterality Date   CESAREAN SECTION       DILATION AND CURETTAGE, DIAGNOSTIC / THERAPEUTIC       HYSTERECTOMY   2015    [facility] - Dr Defrancesco   TONSILLECTOMY                    OB History     Gravida  2   Para  1   Term  1   Preterm      AB  1   Living  1      SAB      IAB      Ectopic      Molar       Multiple      Live Births           Obstetric Comments  Age at first period 32 Age of first pregnancy 48             Social History           Socioeconomic History   Marital status: Married  Tobacco Use   Smoking status: Never Smoker   Smokeless tobacco: Never Used  Scientific laboratory technician Use: Never used  Substance and Sexual Activity   Alcohol use: Yes      Comment: occasionally   Drug use: No   Sexual activity: Yes      Partners: Male      Birth control/protection: Surgical  Allergies  Allergen Reactions   Codeine Other (See Comments)      Current Medications        Current Outpatient Medications  Medication Sig Dispense Refill   ALPRAZolam (XANAX) 0.25 MG tablet Take 1 tablet (0.25 mg total) by mouth 3 (three) times daily as needed for Sleep or Anxiety 270 tablet 0   ascorbic acid, vitamin C, (VITAMIN C) 1000 MG tablet Take 1,000 mg by mouth once daily       desvenlafaxine succinate (PRISTIQ) 50 MG ER tablet Take 1 tablet (50 mg total) by mouth once daily 90 tablet 3   estradioL (ESTRACE) 1 MG tablet Take 1 tablet (1 mg total) by mouth once daily 90 tablet 3   hydroCHLOROthiazide (HYDRODIURIL) 25 MG tablet Take 1 tablet (25 mg total) by mouth once daily 90 tablet 3   ibuprofen (MOTRIN) 800 MG tablet TAKE 1 TABLET BY MOUTH 3 TIMES DAILY 270 tablet 1   multivitamin tablet Take 1 tablet by mouth once daily       phentermine (ADIPEX-P) 37.5 mg tablet TAKE 1 TABLET BY MOUTH EVERY MORNING BEFORE BREAKFAST 90 tablet 1   ZINC ACETATE ORAL Take by mouth       ergocalciferol, vitamin D2, 1,250 mcg (50,000 unit) capsule Take 1 capsule (50,000 Units total) by mouth once a week for 30 days 12 capsule 3   metFORMIN (GLUCOPHAGE) 500 MG tablet Take 1 tablet (500 mg total) by mouth 2 (two) times daily with meals (Patient not taking: Reported on 11/09/2020) 180 tablet 3   UNIFINE PENTIPS 31 gauge x 3/16" needle  (Patient not taking: Reported on 11/09/2020)        No  current facility-administered medications for this visit.             Family History  Problem Relation Age of Onset   No Known Problems Mother     Alcohol abuse Father     Diabetes Paternal Grandmother     Heart disease Paternal Grandmother     Diabetes type II Other     High blood pressure (Hypertension) Other     Coronary Artery Disease (Blocked arteries around heart) Other     Cancer Neg Hx          Labs and Radiology:    Abdominal pelvic CT dated November 06, 2020:   This study was independently reviewed.  Left greater than right inguinal hernia.   Laboratory review October 09, 2020:   WBC [facility] Blood Cell Count) 4.1 - 10.2 10^3/uL 6.2   RBC (Red Blood Cell Count) 4.04 - 5.48 10^6/uL 4.85   Hemoglobin 12.0 - 15.0 gm/dL 15.2 High    Hematocrit 35.0 - 47.0 % 43.4   MCV (Mean Corpuscular Volume) 80.0 - 100.0 fl 89.5   MCH (Mean Corpuscular Hemoglobin) 27.0 - 31.2 pg 31.3 High    MCHC (Mean Corpuscular Hemoglobin Concentration) 32.0 - 36.0 gm/dL 35.0   Platelet Count 150 - 450 10^3/uL 265   RDW-CV (Red Cell Distribution Width) 11.6 - 14.8 % 11.7   MPV (Mean Platelet Volume) 9.4 - 12.4 fl 10.2   Neutrophils 1.50 - 7.80 10^3/uL 3.78   Lymphocytes 1.00 - 3.60 10^3/uL 1.98   Monocytes 0.00 - 1.50 10^3/uL 0.40   Eosinophils 0.00 - 0.55 10^3/uL 0.04   Basophils 0.00 - 0.09 10^3/uL 0.03   Neutrophil % 32.0 - 70.0 % 60.6   Lymphocyte % 10.0 - 50.0 % 31.7   Monocyte % 4.0 - 13.0 % 6.4  Eosinophil % 1.0 - 5.0 % 0.6 Low    Basophil% 0.0 - 2.0 % 0.5   Immature Granulocyte % <=0.7 % 0.2   Immature Granulocyte Count <=0.06 10^3/L 0.01     Glucose 70 - 110 mg/dL 90   Sodium 136 - 145 mmol/L 139   Potassium 3.6 - 5.1 mmol/L 3.7   Chloride 97 - 109 mmol/L 100   Carbon Dioxide (CO2) 22.0 - 32.0 mmol/L 30.4   Urea Nitrogen (BUN) 7 - 25 mg/dL 15   Creatinine 0.6 - 1.1 mg/dL 0.6   Glomerular Filtration Rate (eGFR), MDRD Estimate >60 mL/min/1.73sq m 106   Calcium 8.7 - 10.3 mg/dL  9.3   AST  8 - 39 U/L 17   ALT  5 - 38 U/L 17   Alk Phos (alkaline Phosphatase) 34 - 104 U/L 60   Albumin 3.5 - 4.8 g/dL 4.1   Bilirubin, Total 0.3 - 1.2 mg/dL 0.7   Protein, Total 6.1 - 7.9 g/dL 6.8   A/G Ratio 1.0 - 5.0 gm/dL 1.5     Review of the August 02, 2013 operative report by Vivia Budge, MD when she underwent TAH/BSO for uterine fibroids, menorrhagia, anemia and dysmenorrhea reports significant pelvic adhesions with obliteration of the cul-de-sac.  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        Objective:   Physical Exam Exam conducted with a chaperone present.  Constitutional:      Appearance: Normal appearance.  Cardiovascular:     Rate and Rhythm: Normal rate and regular rhythm.     Pulses: Normal pulses.     Heart sounds: Normal heart sounds.  Pulmonary:     Effort: Pulmonary effort is normal.     Breath sounds: Normal breath sounds.  Abdominal:     General: Abdomen is protuberant. Bowel sounds are normal.     Palpations: Abdomen is soft.     Tenderness: There is no abdominal tenderness.     Hernia: A hernia is present. Hernia is present in the left inguinal area and right inguinal area.       Comments: Left-sided impulse more pronounced than right.  Musculoskeletal:     Cervical back: Neck supple.  Skin:    General: Skin is warm and dry.  Neurological:     Mental Status: She is alert and oriented to person, place, and time.  Psychiatric:        Mood and Affect: Mood normal.        Behavior: Behavior normal.           Assessment:     Symptomatic, small right inguinal hernia; slightly larger, minimally symptomatic left inguinal hernia.    Plan:     Options for management were reviewed: Both defects are small, and do not mandate urgent repair.  She could have 1 or both repaired at the same time.  Certainly with the right side being symptomatic this would be the primary focus.   We reviewed open and  laparoscopic/robotic procedures.  I would be glad to recommend her to another physician if she desired "minimally invasive" repair.  She is aware that the recovery, recurrence and return to work are about the same between open and laparoscopic procedures.   The role of prosthetic mesh was discussed with its 1% increased risks of infection but marked improvement in postoperative pain and allowing an earlier return to full activities.   She is of age to consider colon cancer screening.  No family  history so she be a candidate for Cologuard or colonoscopy when she desires to proceed in this direction.   Tentatively, surgery is scheduled for November 27, 2020.      This note is partially prepared by Karie Fetch, RN, acting as a scribe in the presence of Dr. Hervey Ard, MD.  The documentation recorded by the scribe accurately reflects the service I personally performed and the decisions made by me.    Robert Bellow, MD FACS

## 2020-11-17 ENCOUNTER — Other Ambulatory Visit: Payer: Self-pay

## 2020-11-17 ENCOUNTER — Inpatient Hospital Stay: Admission: RE | Admit: 2020-11-17 | Payer: No Typology Code available for payment source | Source: Ambulatory Visit

## 2020-11-17 ENCOUNTER — Encounter
Admission: RE | Admit: 2020-11-17 | Discharge: 2020-11-17 | Disposition: A | Discharge status: Home / Self Care | Payer: No Typology Code available for payment source | Source: Ambulatory Visit | Attending: General Surgery | Admitting: General Surgery

## 2020-11-17 VITALS — Ht 61.0 in | Wt 204.0 lb

## 2020-11-17 DIAGNOSIS — Z79899 Other long term (current) drug therapy: Secondary | ICD-10-CM | POA: Insufficient documentation

## 2020-11-17 DIAGNOSIS — Z01818 Encounter for other preprocedural examination: Secondary | ICD-10-CM | POA: Insufficient documentation

## 2020-11-17 DIAGNOSIS — I10 Essential (primary) hypertension: Secondary | ICD-10-CM

## 2020-11-17 HISTORY — DX: Other specified postprocedural states: Z98.890

## 2020-11-17 HISTORY — DX: Nausea with vomiting, unspecified: R11.2

## 2020-11-17 LAB — POTASSIUM: Potassium: 3 mmol/L — ABNORMAL LOW (ref 3.5–5.1)

## 2020-11-17 MED ORDER — POTASSIUM CHLORIDE CRYS ER 20 MEQ PO TBCR
20.0000 meq | EXTENDED_RELEASE_TABLET | Freq: Every day | ORAL | 0 refills | Status: DC
  Filled 2020-11-17: qty 3, 3d supply, fill #0

## 2020-11-17 NOTE — Patient Instructions (Addendum)
Your procedure is scheduled on: Monday 11/27/20 Report to the Registration Desk on the 1st floor of the [facility]. To find out your arrival time, please call [phone] between 1PM - 3PM on: Friday 11/24/20  REMEMBER: Instructions that are not followed completely may result in serious medical risk, up to and including death; or upon the discretion of your surgeon and anesthesiologist your surgery may need to be rescheduled.  Do not eat food after midnight the night before surgery.  No gum chewing, lozengers or hard candies.  You may however, drink CLEAR liquids up to 2 hours before you are scheduled to arrive for your surgery. Do not drink anything within 2 hours of your scheduled arrival time.  Clear liquids include: - water  - apple juice without pulp - gatorade (not RED, PURPLE, OR BLUE) - black coffee or tea (Do NOT add milk or creamers to the coffee or tea) Do NOT drink anything that is not on this list.  TAKE THESE MEDICATIONS THE MORNING OF SURGERY WITH A SIP OF WATER: desvenlafaxine (PRISTIQ) 50 MG 24 hr tablet   One week prior to surgery: Stop Anti-inflammatories (NSAIDS) such as Advil, Aleve, Ibuprofen, Motrin, Naproxen, Naprosyn and Aspirin based products such as Excedrin, Goodys Powder, BC Powder. Stop taking your b complex vitamins capsule, Multiple Vitamins-Minerals (MULTIVITAMIN WITH MINERALS) tablet, vitamin C (ASCORBIC ACID) 500 MG tablet, Vitamin D, Ergocalciferol, (DRISDOL) 1.25 MG (50000 UNIT) CAPS capsule and ANY other OVER THE COUNTER supplements until after surgery. You may however, continue to take Tylenol if needed for pain up until the day of surgery.  No Alcohol for 24 hours before or after surgery.  No Smoking including e-cigarettes for 24 hours prior to surgery.  No chewable tobacco products for at least 6 hours prior to surgery.  No nicotine patches on the day of surgery.  Do not use any "recreational" drugs for at least a week prior to your  surgery.  Please be advised that the combination of cocaine and anesthesia may have negative outcomes, up to and including death. If you test positive for cocaine, your surgery will be cancelled.  On the morning of surgery brush your teeth with toothpaste and water, you may rinse your mouth with mouthwash if you wish. Do not swallow any toothpaste or mouthwash.  Use CHG wipes as directed on instruction sheet.  Do not wear jewelry, make-up, hairpins, clips or nail polish.  Do not wear lotions, powders, or perfumes.   Do not shave body from the neck down 48 hours prior to surgery just in case you cut yourself which could leave a site for infection.  Also, freshly shaved skin may become irritated if using the CHG soap.  Do not bring valuables to the hospital. [facility] is not responsible for any missing/lost belongings or valuables.   Notify your doctor if there is any change in your medical condition (cold, fever, infection).  Wear comfortable clothing (specific to your surgery type) to the hospital.  After surgery, you can help prevent lung complications by doing breathing exercises.  Take deep breaths and cough every 1-2 hours. Your doctor may order a device called an Incentive Spirometer to help you take deep breaths. When coughing or sneezing, hold a pillow firmly against your incision with both hands. This is called "splinting." Doing this helps protect your incision. It also decreases belly discomfort.  If you are being discharged the day of surgery, you will not be allowed to drive home. You will need  a responsible adult (18 years or older) to drive you home and stay with you that night.   If you are taking public transportation, you will need to have a responsible adult (18 years or older) with you. Please confirm with your physician that it is acceptable to use public transportation.   Please call the [facility] Dept. at [phone] if you have any questions  about these instructions.  Surgery Visitation Policy:  Patients undergoing a surgery or procedure may have one family member or support person with them as long as that person is not COVID-19 positive or experiencing its symptoms.  That person may remain in the waiting area during the procedure and may rotate out with other people.  Inpatient Visitation:    Visiting hours are 7 a.m. to 8 p.m. Up to two visitors ages 16+ are allowed at one time in a patient room. The visitors may rotate out with other people during the day. Visitors must check out when they leave, or other visitors will not be allowed. One designated support person may remain overnight. The visitor must pass COVID-19 screenings, use hand sanitizer when entering and exiting the patient's room and wear a mask at all times, including in the patient's room. Patients must also wear a mask when staff or their visitor are in the room. Masking is required regardless of vaccination status.

## 2020-11-17 NOTE — Progress Notes (Signed)
  [facility] Perioperative Services: Pre-Admission/Anesthesia Testing  Abnormal Lab Notification and Treatment Plan of Care   Date: 11/17/20  Name: Melinda Flores MRN:   [id number]  Re: Abnormal labs noted during PAT appointment   Notified:  Provider Name Provider Role Notification Mode  Byrnett, Forest Gleason, MD General Surgery Routed and/or faxed via [facility] and Notes:  ABNORMAL LAB VALUE(S): Lab Results  Component Value Date   K 3.0 (L) 11/17/2020   Melinda Flores is scheduled for a HERNIA REPAIR INGUINAL ADULT (Bilateral) on 11/27/2020. In review of the medication reconciliation, it is noted that patient is on daily thiazide diuretic (HCTZ 25 mg). The current minimal accepted level to proceed with general anesthesia is 3.0 mmol/L. If the patient drop any further, the patient's case will need to be postponed pending optimization. In efforts to prevent this from occurring, I will send in a short course of oral K+ (KDUR 20 mEq daily x 3 days). Additionally, patient was educated on K+ rich dietary intake and use of ORS between now and the time of surgery. Post-operatively, discussed with patient that she will need to follow up with her PCP to discuss having repeat labs obtained to ensure that she maintaining a normal K+ level. May need to consider a change in therapy for her HTN management. Patient verbalized understanding. Rx sent to patient's pharmacy as follows:  Meds ordered this encounter  Medications   potassium chloride SA (KLOR-CON) 20 MEQ tablet    Sig: Take 1 tablet (20 mEq total) by mouth daily for 3 days.    Dispense:  3 tablet    Refill:  0   Honor Loh, MSN, APRN, FNP-C, CEN [facility]  Peri-operative Services Nurse Practitioner Phone: [phone] Fax: [phone] 11/17/20 10:58 AM

## 2020-11-21 ENCOUNTER — Other Ambulatory Visit: Payer: Self-pay

## 2020-11-21 MED ORDER — POTASSIUM CHLORIDE CRYS ER 20 MEQ PO TBCR
20.0000 meq | EXTENDED_RELEASE_TABLET | Freq: Two times a day (BID) | ORAL | 0 refills | Status: AC
  Filled 2020-11-21: qty 60, 30d supply, fill #0

## 2020-11-21 MED ORDER — POTASSIUM CHLORIDE CRYS ER 20 MEQ PO TBCR
20.0000 meq | EXTENDED_RELEASE_TABLET | Freq: Two times a day (BID) | ORAL | 0 refills | Status: DC
  Filled 2020-11-21: qty 60, 30d supply, fill #0

## 2020-11-27 ENCOUNTER — Other Ambulatory Visit: Payer: Self-pay

## 2020-11-27 ENCOUNTER — Encounter: Payer: Self-pay | Admitting: General Surgery

## 2020-11-27 ENCOUNTER — Ambulatory Visit: Payer: No Typology Code available for payment source | Admitting: Urgent Care

## 2020-11-27 ENCOUNTER — Ambulatory Visit: Payer: No Typology Code available for payment source | Admitting: Anesthesiology

## 2020-11-27 ENCOUNTER — Ambulatory Visit
Admission: RE | Admit: 2020-11-27 | Discharge: 2020-11-27 | Disposition: A | Discharge status: Home / Self Care | Payer: No Typology Code available for payment source | Attending: General Surgery | Admitting: General Surgery

## 2020-11-27 ENCOUNTER — Encounter
Admission: RE | Disposition: A | Discharge status: Home / Self Care | Payer: Self-pay | Source: Home / Self Care | Attending: General Surgery

## 2020-11-27 DIAGNOSIS — K4090 Unilateral inguinal hernia, without obstruction or gangrene, not specified as recurrent: Secondary | ICD-10-CM | POA: Diagnosis present

## 2020-11-27 HISTORY — PX: INGUINAL HERNIA REPAIR: SHX194

## 2020-11-27 HISTORY — PX: INSERTION OF MESH: SHX5868

## 2020-11-27 LAB — POCT I-STAT, CHEM 8
BUN: 20 mg/dL (ref 6–20)
Calcium, Ion: 1.12 mmol/L — ABNORMAL LOW (ref 1.15–1.40)
Chloride: 100 mmol/L (ref 98–111)
Creatinine, Ser: 0.5 mg/dL (ref 0.44–1.00)
Glucose, Bld: 108 mg/dL — ABNORMAL HIGH (ref 70–99)
HCT: 39 % (ref 36.0–46.0)
Hemoglobin: 13.3 g/dL (ref 12.0–15.0)
Potassium: 4 mmol/L (ref 3.5–5.1)
Sodium: 138 mmol/L (ref 135–145)
TCO2: 29 mmol/L (ref 22–32)

## 2020-11-27 SURGERY — REPAIR, HERNIA, INGUINAL, ADULT
Anesthesia: General | Site: Abdomen | Laterality: Bilateral

## 2020-11-27 MED ORDER — MIDAZOLAM HCL 2 MG/2ML IJ SOLN
INTRAMUSCULAR | Status: AC
  Filled 2020-11-27: qty 2

## 2020-11-27 MED ORDER — KETOROLAC TROMETHAMINE 30 MG/ML IJ SOLN
INTRAMUSCULAR | Status: AC
  Filled 2020-11-27: qty 1

## 2020-11-27 MED ORDER — TRAMADOL HCL 50 MG PO TABS
50.0000 mg | ORAL_TABLET | ORAL | 0 refills | Status: AC | PRN
  Filled 2020-11-27: qty 20, 4d supply, fill #0

## 2020-11-27 MED ORDER — APREPITANT 40 MG PO CAPS: 40.0000 mg | ORAL_CAPSULE | Freq: Once | ORAL | Status: AC

## 2020-11-27 MED ORDER — ONDANSETRON HCL 4 MG/2ML IJ SOLN: 4.0000 mg | Freq: Once | INTRAMUSCULAR | Status: DC | PRN

## 2020-11-27 MED ORDER — CHLORHEXIDINE GLUCONATE 0.12 % MT SOLN
OROMUCOSAL | Status: AC
  Administered 2020-11-27: 15 mL via OROMUCOSAL
  Filled 2020-11-27: qty 15

## 2020-11-27 MED ORDER — LIDOCAINE HCL (PF) 2 % IJ SOLN
INTRAMUSCULAR | Status: AC
  Filled 2020-11-27: qty 5

## 2020-11-27 MED ORDER — MIDAZOLAM HCL 2 MG/2ML IJ SOLN
INTRAMUSCULAR | Status: DC | PRN
  Administered 2020-11-27: 2 mg via INTRAVENOUS

## 2020-11-27 MED ORDER — FAMOTIDINE 20 MG PO TABS: 20.0000 mg | ORAL_TABLET | Freq: Once | ORAL | Status: AC

## 2020-11-27 MED ORDER — ROCURONIUM BROMIDE 10 MG/ML (PF) SYRINGE
PREFILLED_SYRINGE | INTRAVENOUS | Status: AC
  Filled 2020-11-27: qty 10

## 2020-11-27 MED ORDER — PHENYLEPHRINE HCL (PRESSORS) 10 MG/ML IV SOLN
INTRAVENOUS | Status: AC
  Filled 2020-11-27: qty 2

## 2020-11-27 MED ORDER — FAMOTIDINE 20 MG PO TABS
ORAL_TABLET | ORAL | Status: AC
  Administered 2020-11-27: 20 mg via ORAL
  Filled 2020-11-27: qty 1

## 2020-11-27 MED ORDER — BUPIVACAINE-EPINEPHRINE (PF) 0.5% -1:200000 IJ SOLN
INTRAMUSCULAR | Status: AC
  Filled 2020-11-27: qty 30

## 2020-11-27 MED ORDER — LIDOCAINE HCL (CARDIAC) PF 100 MG/5ML IV SOSY
PREFILLED_SYRINGE | INTRAVENOUS | Status: DC | PRN
  Administered 2020-11-27: 100 mg via INTRAVENOUS

## 2020-11-27 MED ORDER — BUPIVACAINE LIPOSOME 1.3 % IJ SUSP
INTRAMUSCULAR | Status: AC
  Filled 2020-11-27: qty 20

## 2020-11-27 MED ORDER — CHLORHEXIDINE GLUCONATE 0.12 % MT SOLN: 15.0000 mL | Freq: Once | OROMUCOSAL | Status: AC

## 2020-11-27 MED ORDER — FENTANYL CITRATE (PF) 100 MCG/2ML IJ SOLN
INTRAMUSCULAR | Status: DC | PRN
  Administered 2020-11-27 (×2): 50 ug via INTRAVENOUS

## 2020-11-27 MED ORDER — DEXMEDETOMIDINE (PRECEDEX) IN NS 20 MCG/5ML (4 MCG/ML) IV SYRINGE
PREFILLED_SYRINGE | INTRAVENOUS | Status: AC
  Filled 2020-11-27: qty 5

## 2020-11-27 MED ORDER — LACTATED RINGERS IV SOLN: INTRAVENOUS | Status: DC

## 2020-11-27 MED ORDER — CEFAZOLIN SODIUM-DEXTROSE 2-4 GM/100ML-% IV SOLN
2.0000 g | INTRAVENOUS | Status: AC
  Administered 2020-11-27: 2 g via INTRAVENOUS

## 2020-11-27 MED ORDER — ONDANSETRON HCL 4 MG/2ML IJ SOLN
INTRAMUSCULAR | Status: DC | PRN
  Administered 2020-11-27: 4 mg via INTRAVENOUS

## 2020-11-27 MED ORDER — APREPITANT 40 MG PO CAPS
ORAL_CAPSULE | ORAL | Status: AC
  Administered 2020-11-27: 40 mg via ORAL
  Filled 2020-11-27: qty 1

## 2020-11-27 MED ORDER — FENTANYL CITRATE (PF) 100 MCG/2ML IJ SOLN
INTRAMUSCULAR | Status: AC
  Filled 2020-11-27: qty 2

## 2020-11-27 MED ORDER — ROCURONIUM BROMIDE 100 MG/10ML IV SOLN
INTRAVENOUS | Status: DC | PRN
  Administered 2020-11-27: 10 mg via INTRAVENOUS
  Administered 2020-11-27: 50 mg via INTRAVENOUS

## 2020-11-27 MED ORDER — BUPIVACAINE-EPINEPHRINE (PF) 0.25% -1:200000 IJ SOLN
INTRAMUSCULAR | Status: AC
  Filled 2020-11-27: qty 30

## 2020-11-27 MED ORDER — SUCCINYLCHOLINE CHLORIDE 200 MG/10ML IV SOSY
PREFILLED_SYRINGE | INTRAVENOUS | Status: AC
  Filled 2020-11-27: qty 10

## 2020-11-27 MED ORDER — DEXAMETHASONE SODIUM PHOSPHATE 10 MG/ML IJ SOLN
INTRAMUSCULAR | Status: DC | PRN
  Administered 2020-11-27: 8 mg via INTRAVENOUS

## 2020-11-27 MED ORDER — SODIUM CHLORIDE (PF) 0.9 % IJ SOLN
INTRAMUSCULAR | Status: DC | PRN
  Administered 2020-11-27 (×2): 20 mL via INTRAMUSCULAR
  Administered 2020-11-27 (×2): 5 mL via INTRAMUSCULAR

## 2020-11-27 MED ORDER — PROPOFOL 10 MG/ML IV BOLUS
INTRAVENOUS | Status: AC
  Filled 2020-11-27: qty 20

## 2020-11-27 MED ORDER — ORAL CARE MOUTH RINSE: 15.0000 mL | Freq: Once | OROMUCOSAL | Status: AC

## 2020-11-27 MED ORDER — FENTANYL CITRATE (PF) 100 MCG/2ML IJ SOLN: 25.0000 ug | INTRAMUSCULAR | Status: DC | PRN

## 2020-11-27 MED ORDER — CHLORHEXIDINE GLUCONATE CLOTH 2 % EX PADS: 6.0000 | MEDICATED_PAD | Freq: Once | CUTANEOUS | Status: DC

## 2020-11-27 MED ORDER — CEFAZOLIN SODIUM-DEXTROSE 2-4 GM/100ML-% IV SOLN
INTRAVENOUS | Status: AC
  Filled 2020-11-27: qty 100

## 2020-11-27 MED ORDER — EPHEDRINE SULFATE 50 MG/ML IJ SOLN
INTRAMUSCULAR | Status: DC | PRN
  Administered 2020-11-27 (×2): 5 mg via INTRAVENOUS

## 2020-11-27 MED ORDER — ONDANSETRON HCL 4 MG/2ML IJ SOLN
INTRAMUSCULAR | Status: AC
  Filled 2020-11-27: qty 2

## 2020-11-27 MED ORDER — KETOROLAC TROMETHAMINE 30 MG/ML IJ SOLN
INTRAMUSCULAR | Status: DC | PRN
  Administered 2020-11-27: 30 mg

## 2020-11-27 MED ORDER — SODIUM CHLORIDE FLUSH 0.9 % IV SOLN
INTRAVENOUS | Status: AC
  Filled 2020-11-27: qty 10

## 2020-11-27 MED ORDER — PROPOFOL 10 MG/ML IV BOLUS
INTRAVENOUS | Status: DC | PRN
  Administered 2020-11-27: 200 mg via INTRAVENOUS

## 2020-11-27 MED ORDER — 0.9 % SODIUM CHLORIDE (POUR BTL) OPTIME
TOPICAL | Status: DC | PRN
  Administered 2020-11-27: 500 mL

## 2020-11-27 MED ORDER — ACETAMINOPHEN 10 MG/ML IV SOLN
INTRAVENOUS | Status: AC
  Filled 2020-11-27: qty 100

## 2020-11-27 MED ORDER — ACETAMINOPHEN 10 MG/ML IV SOLN
INTRAVENOUS | Status: DC | PRN
Start: 2020-11-27 — End: 2020-11-27
  Administered 2020-11-27: 1000 mg via INTRAVENOUS

## 2020-11-27 MED ORDER — BUPIVACAINE HCL (PF) 0.25 % IJ SOLN
INTRAMUSCULAR | Status: AC
  Filled 2020-11-27: qty 30

## 2020-11-27 MED ORDER — DEXAMETHASONE SODIUM PHOSPHATE 10 MG/ML IJ SOLN
INTRAMUSCULAR | Status: AC
  Filled 2020-11-27: qty 1

## 2020-11-27 SURGICAL SUPPLY — 40 items
APL PRP STRL LF DISP 70% ISPRP (MISCELLANEOUS) ×1
BLADE CLIPPER SURG (BLADE) ×1 IMPLANT
BLADE SURG 15 STRL SS SAFETY (BLADE) ×3 IMPLANT
CHLORAPREP W/TINT 26 (MISCELLANEOUS) ×2 IMPLANT
DECANTER SPIKE VIAL GLASS SM (MISCELLANEOUS) ×2 IMPLANT
DRAIN PENROSE 12X.25 LTX STRL (MISCELLANEOUS) ×2 IMPLANT
DRAPE LAPAROTOMY 100X77 ABD (DRAPES) ×2 IMPLANT
DRAPE LAPAROTOMY TRNSV 106X77 (MISCELLANEOUS) ×1 IMPLANT
DRSG TEGADERM 4X4.75 (GAUZE/BANDAGES/DRESSINGS) ×3 IMPLANT
DRSG TELFA 4X3 1S NADH ST (GAUZE/BANDAGES/DRESSINGS) ×2 IMPLANT
ELECT REM PT RETURN 9FT ADLT (ELECTROSURGICAL) ×2
ELECTRODE REM PT RTRN 9FT ADLT (ELECTROSURGICAL) ×1 IMPLANT
GAUZE 4X4 16PLY ~~LOC~~+RFID DBL (SPONGE) ×2 IMPLANT
GLOVE SURG ENC MOIS LTX SZ7.5 (GLOVE) ×2 IMPLANT
GLOVE SURG UNDER LTX SZ8 (GLOVE) ×2 IMPLANT
GOWN STRL REUS W/ TWL LRG LVL3 (GOWN DISPOSABLE) ×2 IMPLANT
GOWN STRL REUS W/TWL LRG LVL3 (GOWN DISPOSABLE) ×4
KIT TURNOVER KIT A (KITS) ×2 IMPLANT
LABEL OR SOLS (LABEL) ×2 IMPLANT
MANIFOLD NEPTUNE II (INSTRUMENTS) ×2 IMPLANT
MESH MARLEX PLUG MEDIUM (Mesh General) ×1 IMPLANT
MESH PLUG HERNIA INGUINAL MD (Mesh General) ×1 IMPLANT
NEEDLE HYPO 22GX1.5 SAFETY (NEEDLE) ×4 IMPLANT
PACK BASIN MINOR ARMC (MISCELLANEOUS) ×2 IMPLANT
PENCIL ELECTRO HAND CTR (MISCELLANEOUS) ×1 IMPLANT
STRIP CLOSURE SKIN 1/2X4 (GAUZE/BANDAGES/DRESSINGS) ×3 IMPLANT
SUT PDS AB 0 CT1 27 (SUTURE) IMPLANT
SUT SURGILON 0 BLK (SUTURE) ×4 IMPLANT
SUT VIC AB 2-0 SH 27 (SUTURE) ×4
SUT VIC AB 2-0 SH 27XBRD (SUTURE) ×1 IMPLANT
SUT VIC AB 3-0 54X BRD REEL (SUTURE) ×1 IMPLANT
SUT VIC AB 3-0 BRD 54 (SUTURE) ×2
SUT VIC AB 3-0 SH 27 (SUTURE) ×4
SUT VIC AB 3-0 SH 27X BRD (SUTURE) ×1 IMPLANT
SUT VIC AB 4-0 FS2 27 (SUTURE) ×3 IMPLANT
SWABSTK COMLB BENZOIN TINCTURE (MISCELLANEOUS) ×2 IMPLANT
SYR 10ML LL (SYRINGE) ×2 IMPLANT
SYR 3ML LL SCALE MARK (SYRINGE) ×2 IMPLANT
TAPE TRANSPORE STRL 2 31045 (GAUZE/BANDAGES/DRESSINGS) ×1 IMPLANT
WATER STERILE IRR 500ML POUR (IV SOLUTION) ×2 IMPLANT

## 2020-11-27 NOTE — Anesthesia Procedure Notes (Signed)
Procedure Name: Intubation Date/Time: 11/27/2020 7:51 AM Performed by: Hedda Slade, CRNA Pre-anesthesia Checklist: Patient identified, Patient being monitored, Timeout performed, Emergency Drugs available and Suction available Patient Re-evaluated:Patient Re-evaluated prior to induction Oxygen Delivery Method: Circle system utilized Preoxygenation: Pre-oxygenation with 100% oxygen Induction Type: IV induction Ventilation: Mask ventilation without difficulty Laryngoscope Size: 3 and McGraph Grade View: Grade I Tube type: Oral Tube size: 7.0 mm Number of attempts: 1 Airway Equipment and Method: Stylet Placement Confirmation: ETT inserted through vocal cords under direct vision, positive ETCO2 and breath sounds checked- equal and bilateral Secured at: 21 cm Tube secured with: Tape Dental Injury: Teeth and Oropharynx as per pre-operative assessment

## 2020-11-27 NOTE — Transfer of Care (Signed)
Immediate Anesthesia Transfer of Care Note  Patient: Melinda Flores  Procedure(s) Performed: HERNIA REPAIR INGUINAL ADULT (Bilateral: Abdomen) INSERTION OF MESH, BILATERAL (Bilateral: Abdomen)  Patient Location: PACU  Anesthesia Type:General  Level of Consciousness: sedated  Airway & Oxygen Therapy: Patient Spontanous Breathing and Patient connected to nasal cannula oxygen  Post-op Assessment: Report given to RN and Post -op Vital signs reviewed and stable  Post vital signs: Reviewed and stable  Last Vitals:  Vitals Value Taken Time  BP 137/79 11/27/20 0949  Temp 36 C 11/27/20 0949  Pulse 92 11/27/20 0949  Resp 0 11/27/20 0949  SpO2 98 % 11/27/20 0949  Vitals shown include unvalidated device data.  Last Pain:  Vitals:   11/27/20 0949  TempSrc:   PainSc: 0-No pain         Complications: No notable events documented.

## 2020-11-27 NOTE — Anesthesia Postprocedure Evaluation (Signed)
Anesthesia Post Note  Patient: Merilynn Finland  Procedure(s) Performed: HERNIA REPAIR INGUINAL ADULT (Bilateral: Abdomen) INSERTION OF MESH, BILATERAL (Bilateral: Abdomen)  Patient location during evaluation: PACU Anesthesia Type: General Level of consciousness: awake Pain management: pain level controlled Vital Signs Assessment: post-procedure vital signs reviewed and stable Respiratory status: spontaneous breathing and respiratory function stable Cardiovascular status: blood pressure returned to baseline Anesthetic complications: no   No notable events documented.   Last Vitals:  Vitals:   11/27/20 0947 11/27/20 0949  BP: 137/79 137/79  Pulse: 93 93  Resp: 14 12  Temp: (!) 36 C (!) 36 C  SpO2: 97% 98%    Last Pain:  Vitals:   11/27/20 0949  TempSrc:   PainSc: 0-No pain                 VAN STAVEREN,[place]

## 2020-11-27 NOTE — H&P (Signed)
Melinda Flores [id number] 1970/12/07     HPI: 49 y/o with a symptomatic right inguinal hernia and a larger, but asymptomatic left hernia. For elective repair.   Medications Prior to Admission  Medication Sig Dispense Refill Last Dose   ALPRAZolam (XANAX) 0.25 MG tablet Take 1 tablet (0.25 mg total) by mouth 3 (three) times daily as needed for Sleep or Anxiety 270 tablet 0 Past Month   b complex vitamins capsule Take 1 capsule by mouth daily.   Past Week   desvenlafaxine (PRISTIQ) 50 MG 24 hr tablet Take 1 tablet (50 mg total) by mouth once daily 90 tablet 3 11/26/2020   estradiol (ESTRACE) 1 MG tablet Take 1 tablet (1 mg total) by mouth once daily 90 tablet 3 11/26/2020   hydrochlorothiazide (HYDRODIURIL) 25 MG tablet Take 1 tablet (25 mg total) by mouth once daily 90 tablet 3 11/26/2020   ibuprofen (ADVIL) 200 MG tablet Take 800 mg by mouth every 8 (eight) hours as needed for moderate pain.   Past Week   Multiple Vitamins-Minerals (MULTIVITAMIN WITH MINERALS) tablet Take 1 tablet by mouth daily.   Past Week   phentermine (ADIPEX-P) 37.5 MG tablet TAKE 1 TABLET BY MOUTH EVERY MORNING BEFORE BREAKFAST 90 tablet 1 Past Month   potassium chloride SA (KLOR-CON) 20 MEQ tablet Take 1 tablet (20 mEq total) by mouth 2 (two) times daily 60 tablet 0 11/26/2020   potassium chloride SA (KLOR-CON) 20 MEQ tablet Take 1 tablet (20 mEq total) by mouth 2 (two) times daily 60 tablet 0 11/26/2020   vitamin C (ASCORBIC ACID) 500 MG tablet Take 500 mg by mouth daily.   Past Week   Vitamin D, Ergocalciferol, (DRISDOL) 1.25 MG (50000 UNIT) CAPS capsule TAKE 1 CAPSULE BY MOUTH ONCE A WEEK (Patient taking differently: Take 50,000 Units by mouth every Friday.) 12 capsule 3 Past Week   potassium chloride SA (KLOR-CON) 20 MEQ tablet Take 1 tablet (20 mEq total) by mouth daily for 3 days. (Patient not taking: Reported on 11/27/2020) 3 tablet 0 Completed Course   Allergies  Allergen Reactions   Codeine      Childhood reaction    Past Medical History:  Diagnosis Date   Anemia    Dyspareunia, female    Elevated glucose    Endometriosis    Fibroids    Obesity (BMI 35.0-39.9 without comorbidity)    Pelvic adhesive disease    PONV (postoperative nausea and vomiting)    Premature menopause    S/P total abdominal hysterectomy and bilateral salpingo-oophorectomy    Past Surgical History:  Procedure Laterality Date   ABDOMINAL HYSTERECTOMY     CESAREAN SECTION     DILATION AND CURETTAGE OF UTERUS     TONSILLECTOMY     Social History   Socioeconomic History   Marital status: Married    Spouse name: Not on file   Number of children: Not on file   Years of education: Not on file   Highest education level: Not on file  Occupational History   Not on file  Tobacco Use   Smoking status: Never   Smokeless tobacco: Never  Vaping Use   Vaping Use: Never used  Substance and Sexual Activity   Alcohol use: Yes    Comment: occas   Drug use: No   Sexual activity: Yes    Birth control/protection: Surgical  Other Topics Concern   Not on file  Social History Narrative   Not on file   Social Determinants  of Health   Financial Resource Strain: Not on file  Food Insecurity: Not on file  Transportation Needs: Not on file  Physical Activity: Not on file  Stress: Not on file  Social Connections: Not on file  Intimate Partner Violence: Not on file   Social History   Social History Narrative   Not on file     ROS: Negative.     PE: HEENT: Negative. Lungs: Clear. Cardio: RR.    Assessment/Plan:  Proceed with planned bilateral inguinal hernia repair with prosthetic mesh.  Forest Gleason [facility] 11/27/2020

## 2020-11-27 NOTE — Op Note (Signed)
Preoperative diagnosis symptomatic right, asymptomatic left inguinal hernia.  Postoperative diagnosis: Same.  Operative procedure: Repair of indirect inguinal hernia bilaterally with medium Bard PerFix plug and patch.  Operating Surgeon: Hervey Ard, MD.  Assistant: Elsie Stain, RNFA.  Anesthesia: General endotracheal; Exparel 20 cc, Marcaine 0.25% plain, 30 cc, injectable saline 10 cc.  Toradol: 60 mg.  Estimated blood loss: 5 cc.  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  Prior imaging showed bilateral hernias.  Clinical exam showed the left, asymptomatic coronary actually larger than the right.  She was admitted for elective repair.  She had been apprised of the availability of laparoscopic/robotic procedures.  SCD stockings for DVT prevention.  She received Ancef on induction of anesthesia.  Hair was removed with clippers.  Operative note after the induction of general anesthesia a restraint was placed on the mid abdomen to help lift the abdominal panniculus off the surgical area.  This was then cleansed with ChloraPrep and draped.  Field block anesthesia was established.  5 cm skin line incision along the anticipated course of the right was made for.  The skin was incised sharply and remaining dissection with electrocautery.  The external oblique was opened in the direction of its fibers.  The round ligament was mobilized and the lipoma structure with hernia sac was freed and returned to the preperitoneal space.  A medium Bard PerFix plug was anchored to the ilio-pubic tract and the fascia with a single 0 Surgilon figure-of-eight suture.  The onlay mesh was then placed and anchored to the pubic tubercle with 0 Surgilon along the inguinal ligament with similar simple sutures.  The medial and superior borders were anchored to the transverse abdominis aponeurosis.  The round ligament was divided and excised.  The wound was  flooded with the remaining local anesthetic and 30 mg of Toradol, 30 cc of local anesthetic on each side).  The external oblique was closed with a running 2-0 Vicryl.  Scarpa's fascia was closed with a running 3-0 Vicryl.  The skin was closed with a running 4-0 Vicryl subcuticular suture.  Benzoin, Steri-Strips, Telfa and Tegaderm dressing applied.  The left side was approached in a similar fashion.  A 5 cm skin line incision, exposure of the external oblique and field block anesthesia as noted above.  The hernia and adipose pocket on the side was larger.  The iliohypogastric nerve was identified and protected.  A small branch going across the round ligament was ligated with a 3-0 Vicryl suture to provide better exposure.  The hernia sac and adipose tissue was returned to the preperitoneal space and again a medium Bard PerFix plug and patch utilized and placed and is noted above.  The layered closure of the anterior abdominal wall was completed as noted above.  Benzoin, Steri-Strips, Telfa and Tegaderm dressings were applied.  Patient tolerated procedure well and was taken to recovery in stable condition.

## 2020-11-27 NOTE — Anesthesia Preprocedure Evaluation (Signed)
Anesthesia Evaluation  Patient identified by MRN, date of birth, ID band Patient awake    Reviewed: Allergy & Precautions, NPO status , Patient's Chart, lab work & pertinent test results  History of Anesthesia Complications (+) PONV  Airway Mallampati: II  TM Distance: >3 FB Neck ROM: full    Dental  (+) Teeth Intact   Pulmonary neg pulmonary ROS,    Pulmonary exam normal breath sounds clear to auscultation       Cardiovascular Exercise Tolerance: Good hypertension, Pt. on medications negative cardio ROS Normal cardiovascular exam Rhythm:Regular Rate:Normal     Neuro/Psych Anxiety negative neurological ROS  negative psych ROS   GI/Hepatic negative GI ROS, Neg liver ROS,   Endo/Other  negative endocrine ROS  Renal/GU negative Renal ROS  negative genitourinary   Musculoskeletal negative musculoskeletal ROS (+)   Abdominal (+) + obese,   Peds negative pediatric ROS (+)  Hematology negative hematology ROS (+) Blood dyscrasia, anemia ,   Anesthesia Other Findings Past Medical History: No date: Anemia No date: Dyspareunia, female No date: Elevated glucose No date: Endometriosis No date: Fibroids No date: Obesity (BMI 35.0-39.9 without comorbidity) 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 No date: PONV (postoperative nausea and vomiting) No date: Premature menopause No date: S/P total abdominal hysterectomy and bilateral salpingo- oophorectomy  Past Surgical History: No date: ABDOMINAL HYSTERECTOMY No date: CESAREAN SECTION No date: DILATION AND CURETTAGE OF UTERUS No date: TONSILLECTOMY     Reproductive/Obstetrics negative OB ROS                             Anesthesia Physical Anesthesia Plan  ASA: 2  Anesthesia Plan: General   Post-op Pain Management:    Induction:   PONV Risk Score and Plan: 1 and Ondansetron, Dexamethasone, Midazolam and Treatment may vary due to age or  medical condition  Airway Management Planned: Oral ETT  Additional Equipment:   Intra-op Plan:   Post-operative Plan:   Informed Consent: I have reviewed the patients History and Physical, chart, labs and discussed the procedure including the risks, benefits and alternatives for the proposed anesthesia with the patient or authorized representative who has indicated his/her understanding and acceptance.     Dental Advisory Given  Plan Discussed with: CRNA and Surgeon  Anesthesia Plan Comments:         Anesthesia Quick Evaluation

## 2020-11-27 NOTE — Discharge Instructions (Signed)
AMBULATORY SURGERY  ?DISCHARGE INSTRUCTIONS ? ? ?The drugs that you were given will stay in your system until tomorrow so for the next 24 hours you should not: ? ?Drive an automobile ?Make any legal decisions ?Drink any alcoholic beverage ? ? ?You may resume regular meals tomorrow.  Today it is better to start with liquids and gradually work up to solid foods. ? ?You may eat anything you prefer, but it is better to start with liquids, then soup and crackers, and gradually work up to solid foods. ? ? ?Please notify your doctor immediately if you have any unusual bleeding, trouble breathing, redness and pain at the surgery site, drainage, fever, or pain not relieved by medication. ? ? ? ?Additional Instructions: ? ? ? ?Please contact your physician with any problems or Same Day Surgery at [phone], Monday through Friday 6 am to 4 pm, or [place] [place] [facility] [place] Main number at [phone].  ?

## 2020-11-28 ENCOUNTER — Encounter: Payer: Self-pay | Admitting: General Surgery

## 2020-12-20 ENCOUNTER — Other Ambulatory Visit: Payer: Self-pay

## 2020-12-25 ENCOUNTER — Other Ambulatory Visit: Payer: Self-pay

## 2021-01-02 ENCOUNTER — Other Ambulatory Visit: Payer: Self-pay

## 2021-01-02 MED ORDER — TRETINOIN 0.025 % EX CREA
TOPICAL_CREAM | CUTANEOUS | 3 refills | Status: AC
  Filled 2021-01-02: qty 20, 30d supply, fill #0

## 2021-03-21 ENCOUNTER — Other Ambulatory Visit: Payer: Self-pay

## 2021-03-21 MED ORDER — PHENTERMINE HCL 37.5 MG PO TABS
ORAL_TABLET | ORAL | 1 refills | Status: DC
  Filled 2021-08-26: qty 90, 90d supply, fill #0

## 2021-04-23 ENCOUNTER — Other Ambulatory Visit: Payer: Self-pay

## 2021-04-23 MED ORDER — LINZESS 145 MCG PO CAPS
ORAL_CAPSULE | ORAL | 1 refills | Status: DC
  Filled 2021-04-23: qty 90, 90d supply, fill #0
  Filled 2021-08-26: qty 90, 90d supply, fill #1

## 2021-04-23 MED ORDER — PHENTERMINE HCL 37.5 MG PO TABS
ORAL_TABLET | ORAL | 1 refills | Status: AC
  Filled 2021-04-23: qty 90, 90d supply, fill #0

## 2021-04-23 MED ORDER — POTASSIUM CHLORIDE CRYS ER 20 MEQ PO TBCR
EXTENDED_RELEASE_TABLET | ORAL | 3 refills | Status: AC
  Filled 2021-04-23: qty 90, 90d supply, fill #0
  Filled 2021-08-26: qty 90, 90d supply, fill #1

## 2021-05-25 ENCOUNTER — Other Ambulatory Visit: Payer: Self-pay

## 2021-08-26 ENCOUNTER — Other Ambulatory Visit: Payer: Self-pay

## 2021-08-27 ENCOUNTER — Other Ambulatory Visit: Payer: Self-pay

## 2021-09-24 ENCOUNTER — Other Ambulatory Visit: Payer: Self-pay | Admitting: Internal Medicine

## 2021-09-24 DIAGNOSIS — Z1231 Encounter for screening mammogram for malignant neoplasm of breast: Secondary | ICD-10-CM

## 2021-10-18 ENCOUNTER — Ambulatory Visit
Admission: RE | Admit: 2021-10-18 | Discharge: 2021-10-18 | Disposition: A | Discharge status: Home / Self Care | Payer: No Typology Code available for payment source | Source: Ambulatory Visit | Attending: Internal Medicine | Admitting: Internal Medicine

## 2021-10-18 DIAGNOSIS — Z1231 Encounter for screening mammogram for malignant neoplasm of breast: Secondary | ICD-10-CM | POA: Insufficient documentation

## 2021-10-23 ENCOUNTER — Other Ambulatory Visit: Payer: Self-pay

## 2021-10-23 MED ORDER — LINZESS 145 MCG PO CAPS
ORAL_CAPSULE | ORAL | 1 refills | Status: DC
  Filled 2021-10-23: qty 90, 90d supply, fill #0

## 2021-10-23 MED ORDER — ALPRAZOLAM 0.25 MG PO TABS
ORAL_TABLET | ORAL | 0 refills | Status: AC
  Filled 2021-10-23: qty 270, 90d supply, fill #0

## 2021-10-23 MED ORDER — PHENTERMINE HCL 37.5 MG PO TABS
ORAL_TABLET | ORAL | 1 refills | Status: DC
  Filled 2021-11-28: qty 90, 90d supply, fill #0
  Filled 2022-02-15: qty 90, 90d supply, fill #1

## 2021-10-23 MED ORDER — DESVENLAFAXINE SUCCINATE ER 50 MG PO TB24
50.0000 mg | ORAL_TABLET | Freq: Every day | ORAL | 3 refills | Status: DC
  Filled 2021-10-23 – 2021-11-28 (×2): qty 90, 90d supply, fill #0
  Filled 2022-02-15: qty 90, 90d supply, fill #1
  Filled 2022-05-31: qty 90, 90d supply, fill #2
  Filled 2022-08-27: qty 90, 90d supply, fill #3

## 2021-10-23 MED ORDER — ESTRADIOL 1 MG PO TABS
ORAL_TABLET | ORAL | 3 refills | Status: DC
  Filled 2021-10-23 – 2021-11-28 (×2): qty 90, 90d supply, fill #0
  Filled 2022-02-15: qty 90, 90d supply, fill #1
  Filled 2022-05-31: qty 90, 90d supply, fill #2
  Filled 2022-08-27: qty 90, 90d supply, fill #3

## 2021-10-23 MED ORDER — MOUNJARO 5 MG/0.5ML ~~LOC~~ SOAJ
SUBCUTANEOUS | 1 refills | Status: AC
  Filled 2021-10-23 – 2021-11-02 (×2): qty 2, 28d supply, fill #0

## 2021-10-23 MED ORDER — HYDROCHLOROTHIAZIDE 25 MG PO TABS
25.0000 mg | ORAL_TABLET | Freq: Every day | ORAL | 3 refills | Status: AC
  Filled 2021-10-23: qty 90, 90d supply, fill #0

## 2021-11-02 ENCOUNTER — Other Ambulatory Visit: Payer: Self-pay

## 2021-11-02 MED ORDER — MOUNJARO 2.5 MG/0.5ML ~~LOC~~ SOAJ
SUBCUTANEOUS | 1 refills | Status: AC
  Filled 2021-11-02: qty 2, 28d supply, fill #0

## 2021-11-05 ENCOUNTER — Other Ambulatory Visit: Payer: Self-pay

## 2021-11-08 ENCOUNTER — Other Ambulatory Visit: Payer: Self-pay

## 2021-11-08 MED ORDER — ONDANSETRON HCL 4 MG PO TABS
ORAL_TABLET | ORAL | 0 refills | Status: AC
  Filled 2021-11-08: qty 30, 10d supply, fill #0

## 2021-11-08 MED ORDER — DIPHENOXYLATE-ATROPINE 2.5-0.025 MG PO TABS
ORAL_TABLET | ORAL | 0 refills | Status: AC
  Filled 2021-11-08: qty 30, 8d supply, fill #0

## 2021-11-09 ENCOUNTER — Other Ambulatory Visit: Payer: Self-pay

## 2021-11-23 ENCOUNTER — Other Ambulatory Visit: Payer: Self-pay

## 2021-11-28 ENCOUNTER — Other Ambulatory Visit: Payer: Self-pay

## 2022-01-09 LAB — COLOGUARD: COLOGUARD: NEGATIVE

## 2022-02-15 ENCOUNTER — Other Ambulatory Visit: Payer: Self-pay

## 2022-02-15 MED ORDER — VITAMIN D (ERGOCALCIFEROL) 1.25 MG (50000 UNIT) PO CAPS
50000.0000 [IU] | ORAL_CAPSULE | ORAL | 3 refills | Status: AC
  Filled 2022-02-15: qty 12, 84d supply, fill #0
  Filled 2022-05-31: qty 12, 84d supply, fill #1
  Filled 2022-08-27: qty 12, 84d supply, fill #2
  Filled 2022-11-20: qty 12, 84d supply, fill #3

## 2022-04-24 ENCOUNTER — Other Ambulatory Visit: Payer: Self-pay

## 2022-05-10 DIAGNOSIS — G4733 Obstructive sleep apnea (adult) (pediatric): Secondary | ICD-10-CM | POA: Diagnosis not present

## 2022-05-10 DIAGNOSIS — Z6841 Body Mass Index (BMI) 40.0 and over, adult: Secondary | ICD-10-CM | POA: Diagnosis not present

## 2022-05-10 DIAGNOSIS — E118 Type 2 diabetes mellitus with unspecified complications: Secondary | ICD-10-CM | POA: Diagnosis not present

## 2022-05-10 DIAGNOSIS — E782 Mixed hyperlipidemia: Secondary | ICD-10-CM | POA: Diagnosis not present

## 2022-05-10 DIAGNOSIS — Z79899 Other long term (current) drug therapy: Secondary | ICD-10-CM | POA: Diagnosis not present

## 2022-05-10 DIAGNOSIS — F411 Generalized anxiety disorder: Secondary | ICD-10-CM | POA: Diagnosis not present

## 2022-05-10 DIAGNOSIS — I10 Essential (primary) hypertension: Secondary | ICD-10-CM | POA: Diagnosis not present

## 2022-05-13 DIAGNOSIS — E118 Type 2 diabetes mellitus with unspecified complications: Secondary | ICD-10-CM | POA: Diagnosis not present

## 2022-05-27 DIAGNOSIS — M899 Disorder of bone, unspecified: Secondary | ICD-10-CM | POA: Diagnosis not present

## 2022-05-31 ENCOUNTER — Other Ambulatory Visit: Payer: Self-pay

## 2022-07-29 IMAGING — MG MM DIGITAL SCREENING BILAT W/ TOMO AND CAD
8 series · 8 of 24 positions shown · non-contrast
Comparison: Previous exam(s).

CLINICAL DATA: Screening.

EXAM:
DIGITAL SCREENING BILATERAL MAMMOGRAM WITH TOMOSYNTHESIS AND CAD
TECHNIQUE: Bilateral screening digital craniocaudal and mediolateral oblique
mammograms were obtained. Bilateral screening digital breast
tomosynthesis was performed. The images were evaluated with
computer-aided detection.

[R CC synth-2D]
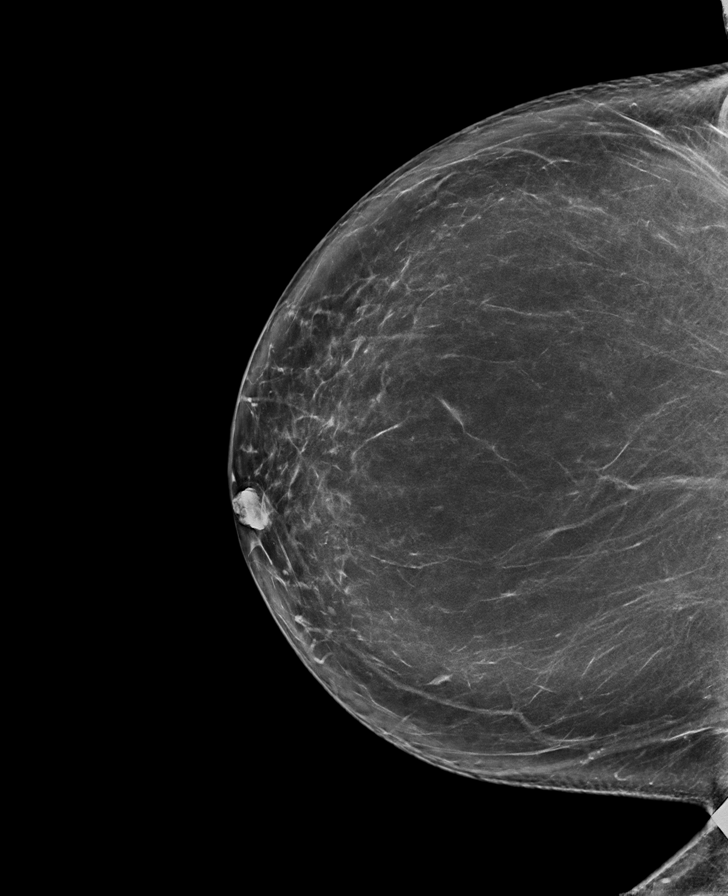

[R MLO synth-2D]
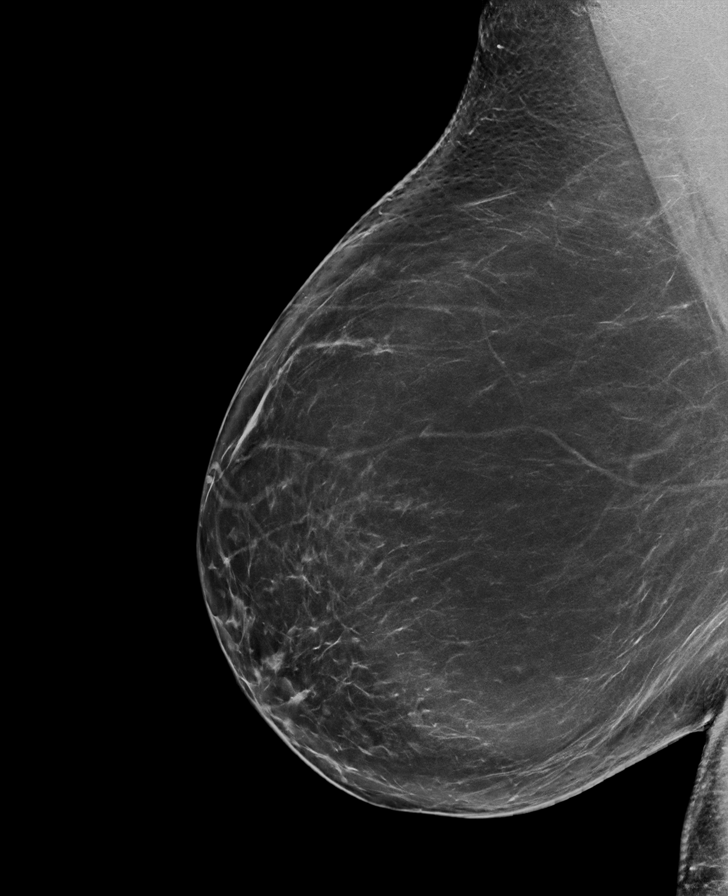

[L MLO synth-2D]
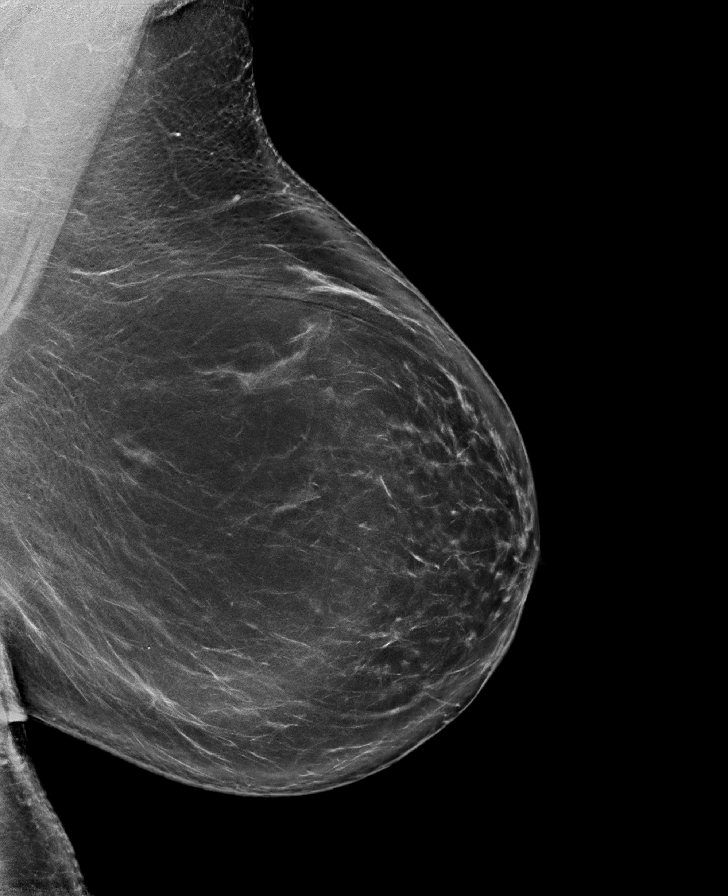

[L CC synth-2D]
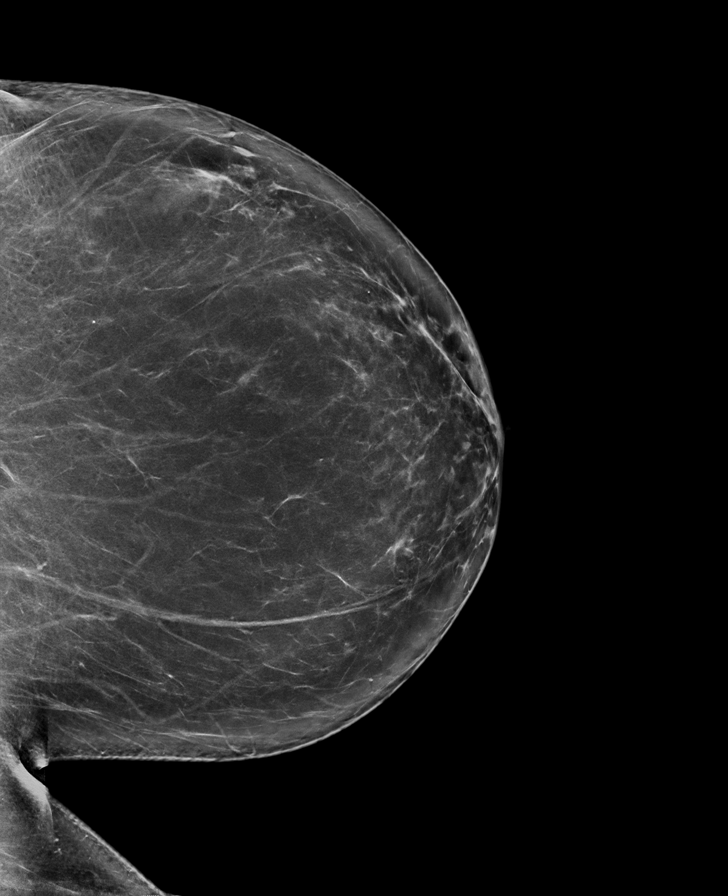

[R CC tomo · tomo slice 45/89.0]
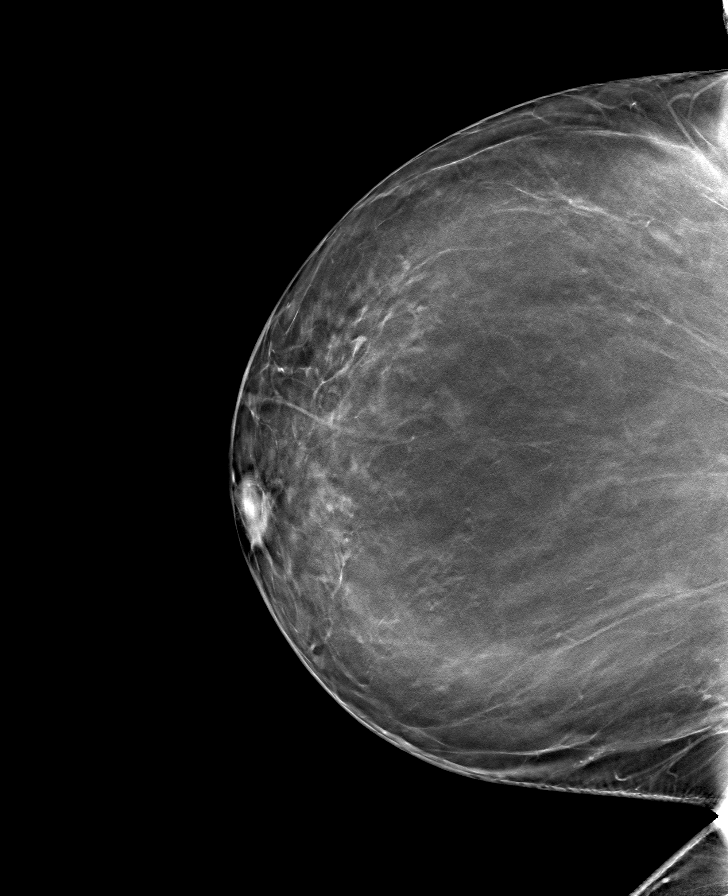

[L CC tomo · tomo slice 44/87.0]
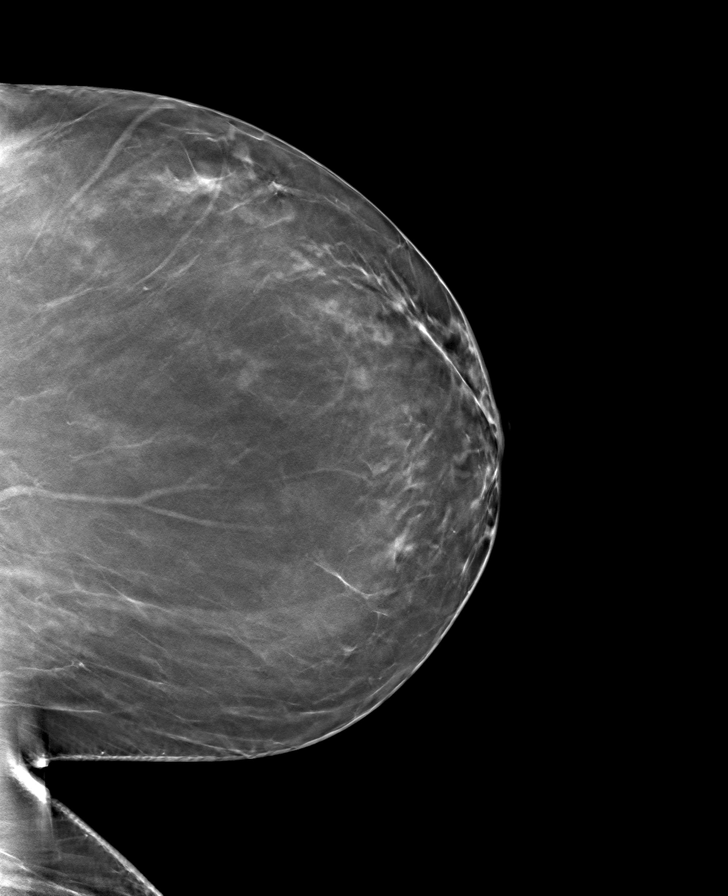

[L MLO tomo · tomo slice 49/96.0]
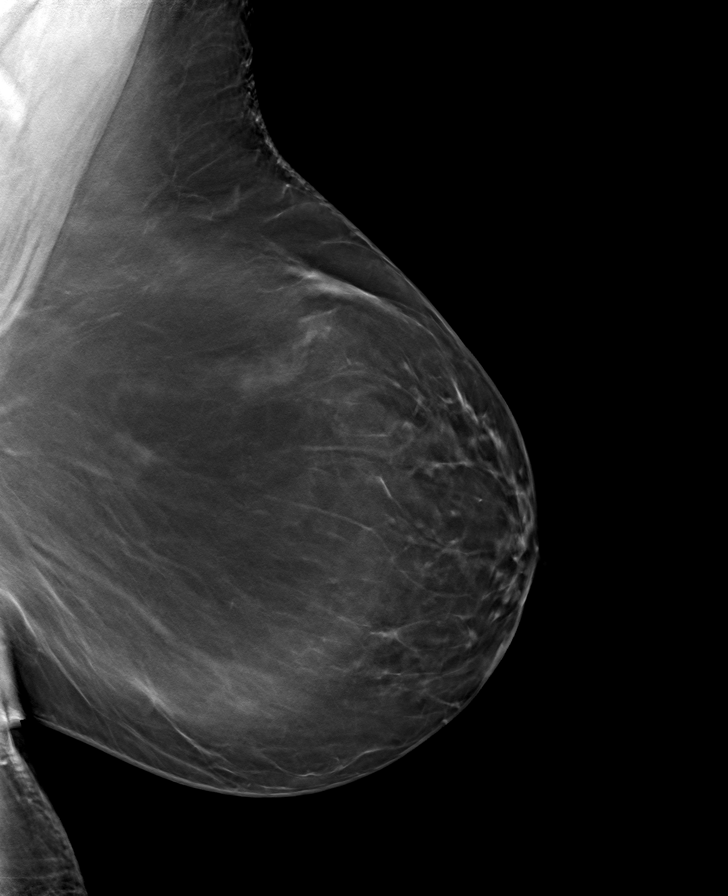

[R MLO tomo · tomo slice 47/92.0]
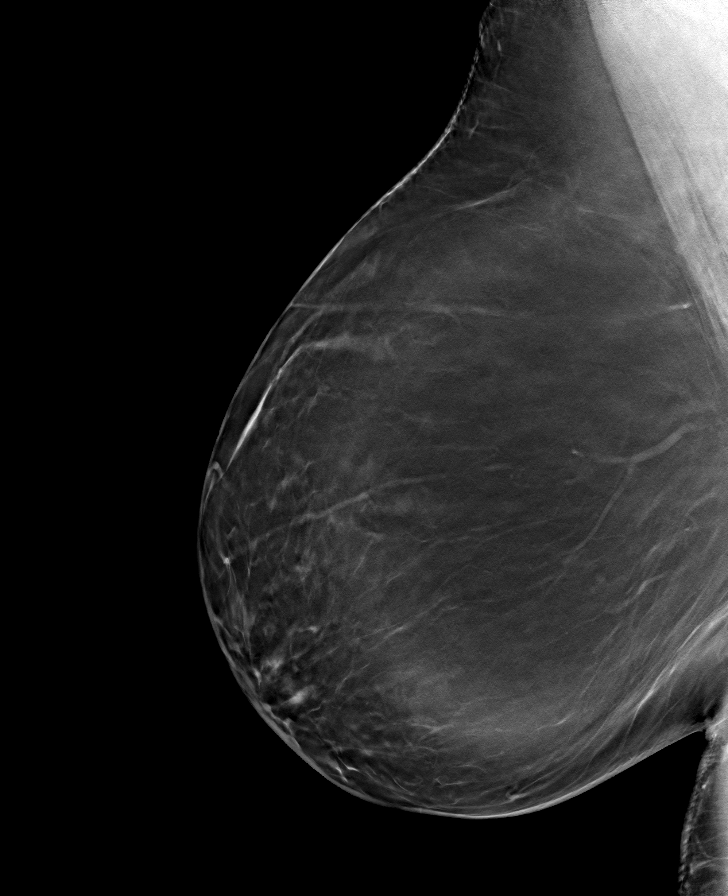

[8 of 24 positions shown; findings below may reference images not displayed]

ACR Breast Density Category b: There are scattered areas of
fibroglandular density.
FINDINGS: There are no findings suspicious for malignancy.
IMPRESSION: No mammographic evidence of malignancy. A result letter of this
screening mammogram will be mailed directly to the patient.

RECOMMENDATION:
Screening mammogram in one year. (Code:51-O-LD2)

BI-RADS CATEGORY  1: Negative.

## 2022-08-18 IMAGING — CT CT ABD-PELV W/ CM
2 of 5 series · 16 of 46 positions shown, 18 images · IV contrast (omnipaque)
Comparison: None.

CLINICAL DATA: Intermittent right inguinal discomfort.

EXAM:
CT ABDOMEN AND PELVIS WITH CONTRAST
TECHNIQUE: Multidetector CT imaging of the abdomen and pelvis was performed
using the standard protocol following bolus administration of
intravenous contrast.
CONTRAST:  100mL OMNIPAQUE IOHEXOL 350 MG/ML SOLN

[Series 2: abd pelvis 5.00 · axial · 0.73mm/px · z∈[-1533,-1093]mm · 13 of 100 slices shown, 15 images]
[im 6/100  soft-tissue]
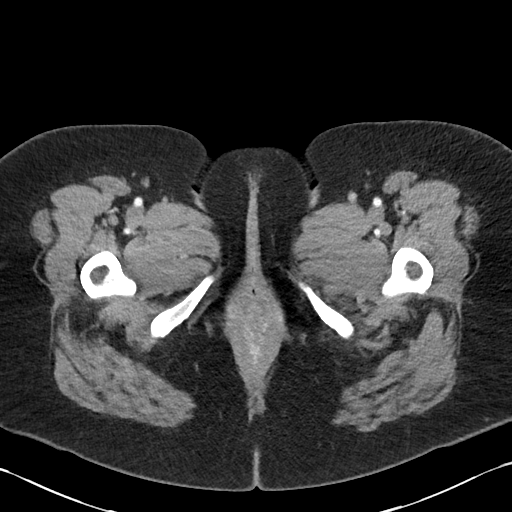
[im 6/100  bone]
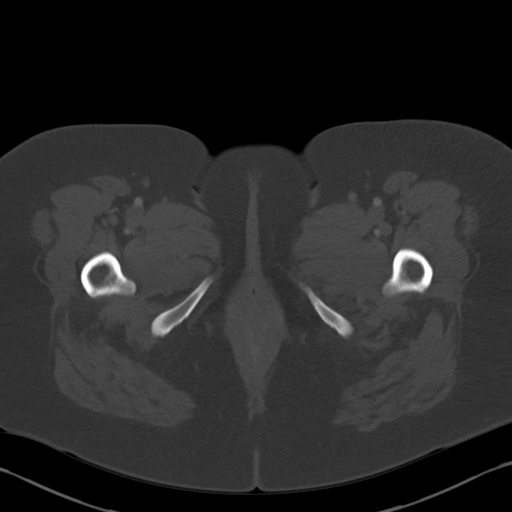
[im 12/100  soft-tissue]
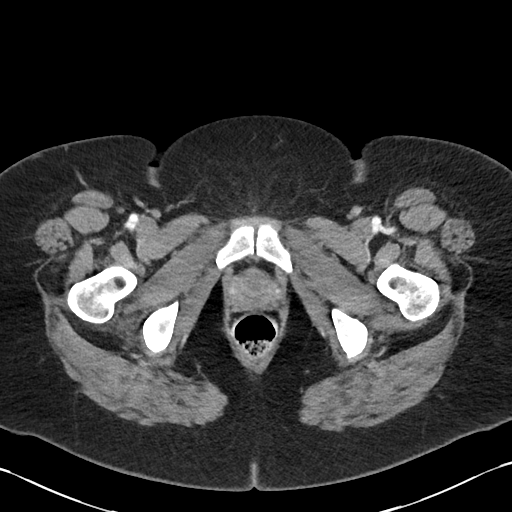
[im 23/100  soft-tissue]
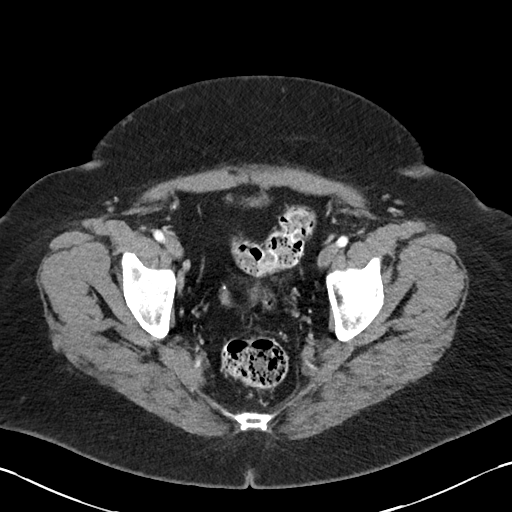
[im 28/100  soft-tissue]
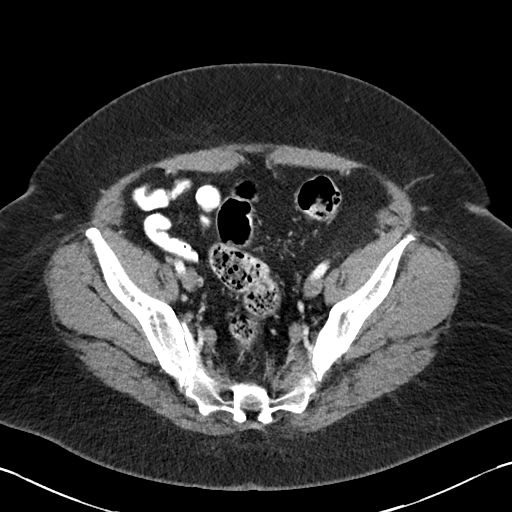
[im 34/100  soft-tissue]
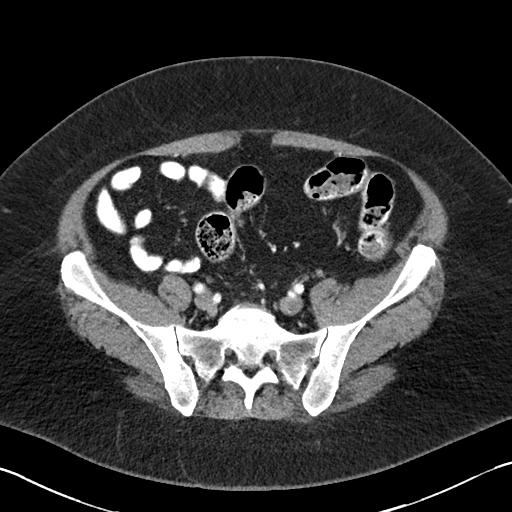
[im 45/100  soft-tissue]
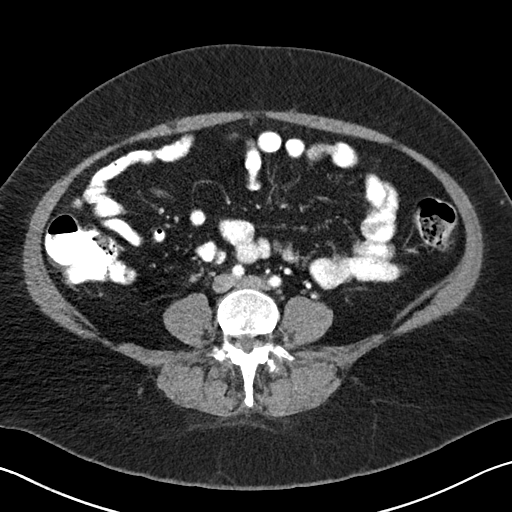
[im 50/100  soft-tissue]
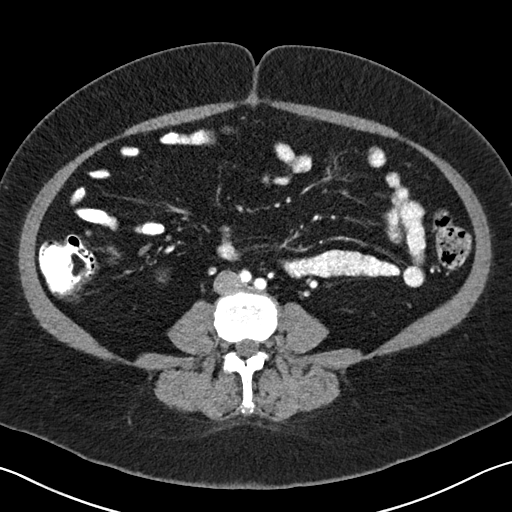
[im 56/100  soft-tissue]
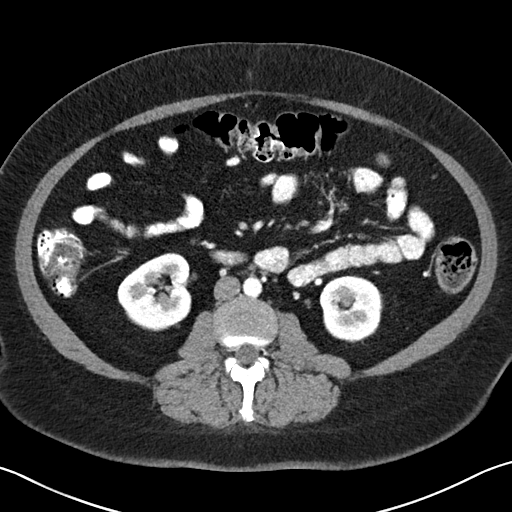
[im 67/100  soft-tissue]
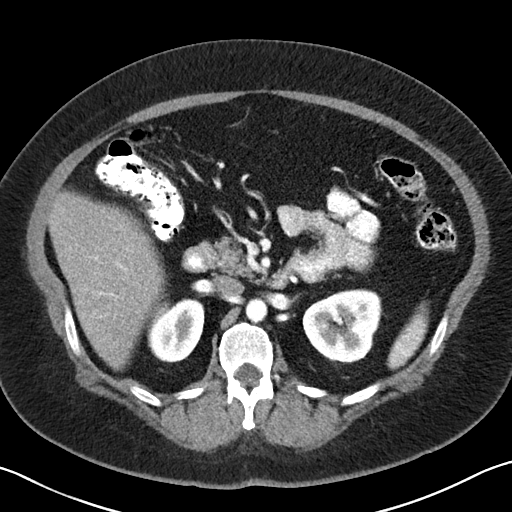
[im 67/100  bone]
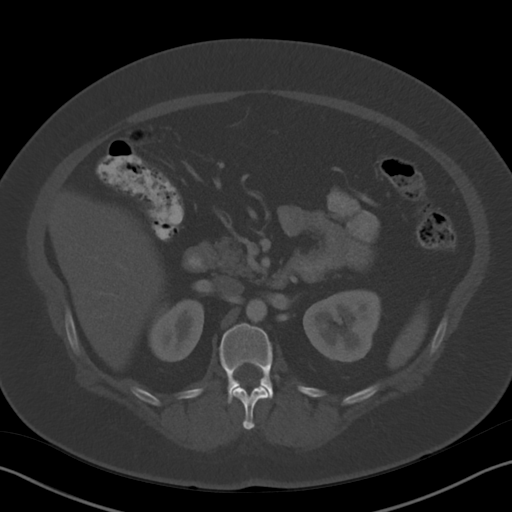
[im 72/100  soft-tissue]
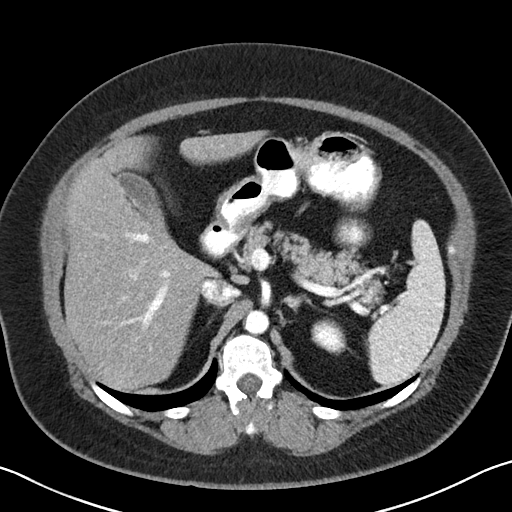
[im 78/100  soft-tissue]
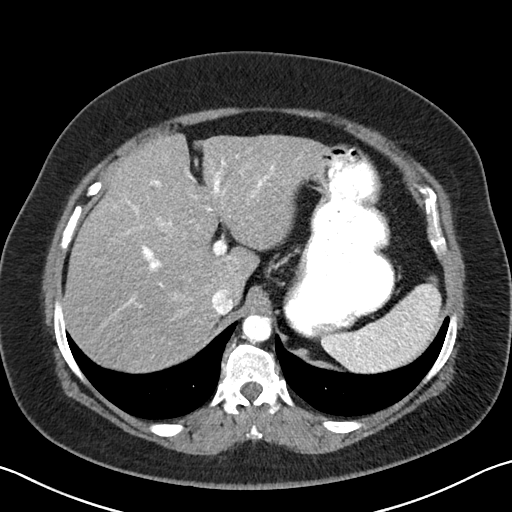
[im 89/100  soft-tissue]
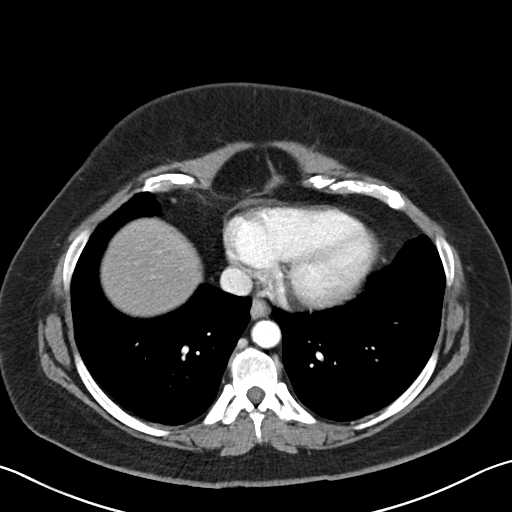
[im 94/100  soft-tissue]
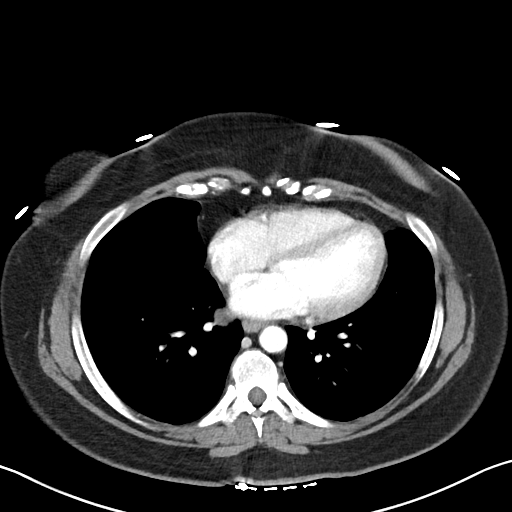

[Series 4: coronals abd pelvis 2.00 cor · coronal · 0.73mm/px · 3 of 166 slices shown]
[im 56/166  soft-tissue]
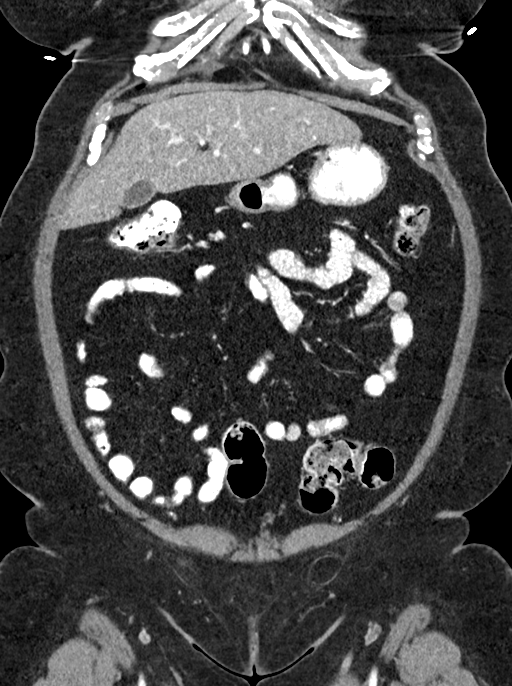
[im 74/166  soft-tissue]
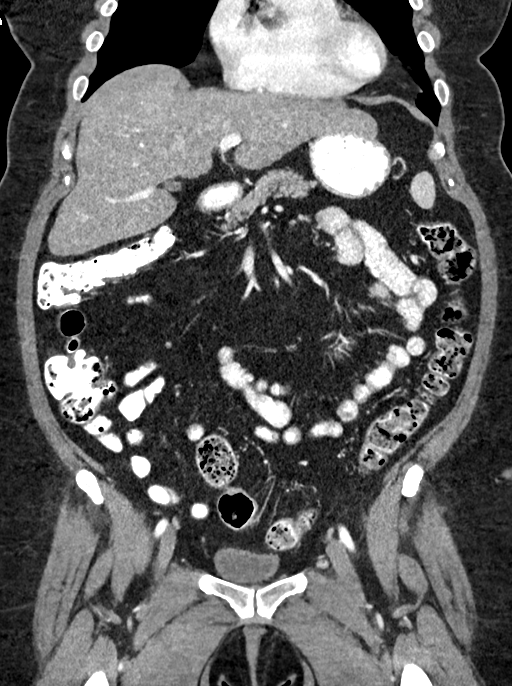
[im 92/166  soft-tissue]
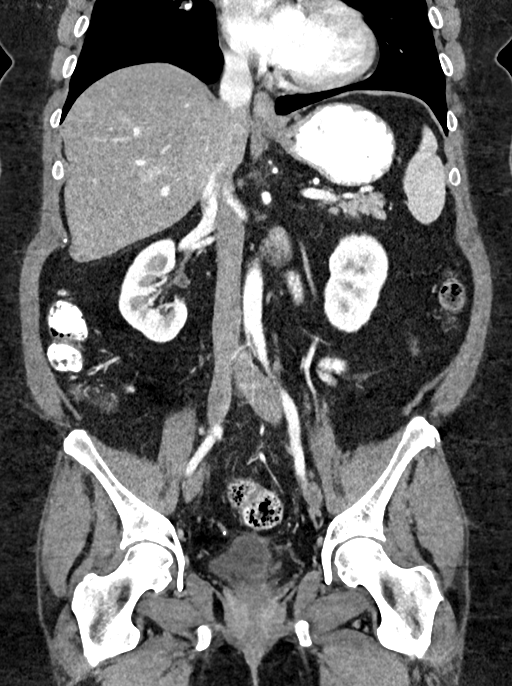

[16 of 46 positions shown; findings below may reference images not displayed]

FINDINGS: Lower chest: No acute abnormality.

Hepatobiliary: Diffuse hepatic steatosis. No suspicious hepatic
lesion. Gallbladder is unremarkable. No biliary ductal dilation.

Pancreas: No pancreatic ductal dilation or evidence of acute
inflammation.

Spleen: Normal in size without focal abnormality.

Adrenals/Urinary Tract: Adrenal glands are unremarkable. Kidneys are
normal, without renal calculi, solid enhancing lesion, or
hydronephrosis. Bladder is unremarkable for degree of distension.

Stomach/Bowel: Radiopaque enteric contrast traverses the rectum.
Stomach is unremarkable for degree of distension. No pathologic
dilation of small or large bowel. The appendix and terminal ileum
appear normal. No evidence of acute bowel inflammation.

Vascular/Lymphatic: No abdominal aortic aneurysm. No pathologically
enlarged abdominal or pelvic lymph nodes.

Reproductive: Status post hysterectomy. No adnexal masses.

Other: Small fat containing left and tiny fat containing right
inguinal hernia.

Musculoskeletal: L3-L4 discogenic disease. No acute osseous
abnormality.
IMPRESSION: 1. No acute abdominopelvic findings.
2. Small fat containing left and tiny fat containing right inguinal
hernia.
3. Diffuse hepatic steatosis.

## 2022-08-27 ENCOUNTER — Other Ambulatory Visit: Payer: Self-pay

## 2022-11-20 ENCOUNTER — Other Ambulatory Visit: Payer: Self-pay

## 2022-11-20 MED ORDER — PHENTERMINE HCL 37.5 MG PO TABS
37.5000 mg | ORAL_TABLET | Freq: Every morning | ORAL | 1 refills | Status: DC
  Filled 2022-11-20: qty 90, 90d supply, fill #0
  Filled 2023-04-23: qty 90, 90d supply, fill #1

## 2022-11-20 MED ORDER — DESVENLAFAXINE SUCCINATE ER 50 MG PO TB24
50.0000 mg | ORAL_TABLET | Freq: Every day | ORAL | 3 refills | Status: DC
  Filled 2022-11-20: qty 90, 90d supply, fill #0
  Filled 2023-03-17: qty 90, 90d supply, fill #1
  Filled 2023-06-16: qty 90, 90d supply, fill #2

## 2022-11-20 MED ORDER — ESTRADIOL 1 MG PO TABS
1.0000 mg | ORAL_TABLET | Freq: Every day | ORAL | 3 refills | Status: DC
  Filled 2022-11-20: qty 90, 90d supply, fill #0
  Filled 2023-03-17: qty 90, 90d supply, fill #1
  Filled 2023-06-16: qty 90, 90d supply, fill #2
  Filled 2023-10-20: qty 90, 90d supply, fill #3

## 2022-11-20 MED ORDER — LINZESS 145 MCG PO CAPS
145.0000 ug | ORAL_CAPSULE | Freq: Every day | ORAL | 1 refills | Status: AC
  Filled 2022-11-20: qty 90, 90d supply, fill #0

## 2022-11-25 ENCOUNTER — Other Ambulatory Visit: Payer: Self-pay

## 2023-03-17 ENCOUNTER — Other Ambulatory Visit: Payer: Self-pay

## 2023-04-23 ENCOUNTER — Other Ambulatory Visit: Payer: Self-pay

## 2023-06-03 ENCOUNTER — Other Ambulatory Visit: Payer: Self-pay

## 2023-06-04 ENCOUNTER — Other Ambulatory Visit: Payer: Self-pay

## 2023-06-10 ENCOUNTER — Other Ambulatory Visit: Payer: Self-pay

## 2023-06-11 ENCOUNTER — Other Ambulatory Visit: Payer: Self-pay

## 2023-06-11 MED ORDER — ERGOCALCIFEROL 1.25 MG (50000 UT) PO CAPS
50000.0000 [IU] | ORAL_CAPSULE | ORAL | 0 refills | Status: AC
  Filled 2023-06-11: qty 12, 84d supply, fill #0

## 2023-07-15 ENCOUNTER — Other Ambulatory Visit: Payer: Self-pay

## 2023-07-15 DIAGNOSIS — Z1331 Encounter for screening for depression: Secondary | ICD-10-CM | POA: Diagnosis not present

## 2023-07-15 DIAGNOSIS — I10 Essential (primary) hypertension: Secondary | ICD-10-CM | POA: Diagnosis not present

## 2023-07-15 DIAGNOSIS — Z01419 Encounter for gynecological examination (general) (routine) without abnormal findings: Secondary | ICD-10-CM | POA: Diagnosis not present

## 2023-07-15 DIAGNOSIS — F419 Anxiety disorder, unspecified: Secondary | ICD-10-CM | POA: Diagnosis not present

## 2023-07-15 DIAGNOSIS — E559 Vitamin D deficiency, unspecified: Secondary | ICD-10-CM | POA: Diagnosis not present

## 2023-07-15 DIAGNOSIS — Z13220 Encounter for screening for lipoid disorders: Secondary | ICD-10-CM | POA: Diagnosis not present

## 2023-07-15 DIAGNOSIS — Z1231 Encounter for screening mammogram for malignant neoplasm of breast: Secondary | ICD-10-CM | POA: Diagnosis not present

## 2023-07-15 DIAGNOSIS — Z79899 Other long term (current) drug therapy: Secondary | ICD-10-CM | POA: Diagnosis not present

## 2023-07-15 DIAGNOSIS — Z0000 Encounter for general adult medical examination without abnormal findings: Secondary | ICD-10-CM | POA: Diagnosis not present

## 2023-07-15 MED ORDER — PHENTERMINE HCL 37.5 MG PO TABS
37.5000 mg | ORAL_TABLET | Freq: Every day | ORAL | 1 refills | Status: AC
  Filled 2023-07-24: qty 90, 90d supply, fill #0

## 2023-07-15 MED ORDER — ALPRAZOLAM 0.25 MG PO TABS
0.2500 mg | ORAL_TABLET | Freq: Three times a day (TID) | ORAL | 0 refills | Status: AC | PRN
  Filled 2023-07-15: qty 270, 90d supply, fill #0

## 2023-07-15 MED ORDER — DESVENLAFAXINE SUCCINATE ER 100 MG PO TB24
100.0000 mg | ORAL_TABLET | Freq: Every day | ORAL | 11 refills | Status: AC
  Filled 2023-07-15: qty 30, 30d supply, fill #0

## 2023-07-16 ENCOUNTER — Other Ambulatory Visit: Payer: Self-pay | Admitting: Internal Medicine

## 2023-07-16 DIAGNOSIS — Z1231 Encounter for screening mammogram for malignant neoplasm of breast: Secondary | ICD-10-CM

## 2023-07-21 ENCOUNTER — Other Ambulatory Visit: Payer: Self-pay | Admitting: Internal Medicine

## 2023-07-21 DIAGNOSIS — Z0000 Encounter for general adult medical examination without abnormal findings: Secondary | ICD-10-CM

## 2023-07-21 DIAGNOSIS — R10813 Right lower quadrant abdominal tenderness: Secondary | ICD-10-CM

## 2023-07-24 ENCOUNTER — Other Ambulatory Visit: Payer: Self-pay

## 2023-07-25 ENCOUNTER — Other Ambulatory Visit: Payer: Self-pay

## 2023-07-25 ENCOUNTER — Other Ambulatory Visit
Admission: RE | Admit: 2023-07-25 | Discharge: 2023-07-25 | Disposition: A | Discharge status: Home / Self Care | Source: Ambulatory Visit | Attending: Internal Medicine | Admitting: Internal Medicine

## 2023-07-25 ENCOUNTER — Ambulatory Visit
Admission: RE | Admit: 2023-07-25 | Discharge: 2023-07-25 | Disposition: A | Discharge status: Home / Self Care | Source: Ambulatory Visit | Attending: Internal Medicine | Admitting: Internal Medicine

## 2023-07-25 DIAGNOSIS — R140 Abdominal distension (gaseous): Secondary | ICD-10-CM | POA: Diagnosis not present

## 2023-07-25 DIAGNOSIS — R10813 Right lower quadrant abdominal tenderness: Secondary | ICD-10-CM | POA: Insufficient documentation

## 2023-07-25 DIAGNOSIS — R1031 Right lower quadrant pain: Secondary | ICD-10-CM | POA: Diagnosis not present

## 2023-07-25 DIAGNOSIS — Z0000 Encounter for general adult medical examination without abnormal findings: Secondary | ICD-10-CM | POA: Insufficient documentation

## 2023-07-25 DIAGNOSIS — K5900 Constipation, unspecified: Secondary | ICD-10-CM | POA: Diagnosis not present

## 2023-07-25 DIAGNOSIS — E559 Vitamin D deficiency, unspecified: Secondary | ICD-10-CM | POA: Insufficient documentation

## 2023-07-25 DIAGNOSIS — K760 Fatty (change of) liver, not elsewhere classified: Secondary | ICD-10-CM | POA: Diagnosis not present

## 2023-07-25 LAB — URINALYSIS, COMPLETE (UACMP) WITH MICROSCOPIC
Bacteria, UA: NONE SEEN
Bilirubin Urine: NEGATIVE
Glucose, UA: NEGATIVE mg/dL
Hgb urine dipstick: NEGATIVE
Ketones, ur: NEGATIVE mg/dL
Leukocytes,Ua: NEGATIVE
Nitrite: NEGATIVE
Protein, ur: NEGATIVE mg/dL
RBC / HPF: 0 RBC/hpf (ref 0–5)
Specific Gravity, Urine: 1.027 (ref 1.005–1.030)
pH: 7 (ref 5.0–8.0)

## 2023-07-25 LAB — COMPREHENSIVE METABOLIC PANEL WITH GFR
ALT: 40 U/L (ref 0–44)
AST: 30 U/L (ref 15–41)
Albumin: 3.8 g/dL (ref 3.5–5.0)
Alkaline Phosphatase: 66 U/L (ref 38–126)
Anion gap: 12 (ref 5–15)
BUN: 10 mg/dL (ref 6–20)
CO2: 23 mmol/L (ref 22–32)
Calcium: 8.8 mg/dL — ABNORMAL LOW (ref 8.9–10.3)
Chloride: 100 mmol/L (ref 98–111)
Creatinine, Ser: 0.58 mg/dL (ref 0.44–1.00)
GFR, Estimated: >60 mL/min (ref >60)
Glucose, Bld: 95 mg/dL (ref 70–99)
Potassium: 3.5 mmol/L (ref 3.5–5.1)
Sodium: 135 mmol/L (ref 135–145)
Total Bilirubin: 0.9 mg/dL (ref 0.0–1.2)
Total Protein: 7.2 g/dL (ref 6.5–8.1)

## 2023-07-25 LAB — CBC WITH DIFFERENTIAL/PLATELET
Abs Immature Granulocytes: 0.01 K/uL (ref 0.00–0.07)
Basophils Absolute: 0 K/uL (ref 0.0–0.1)
Basophils Relative: 0 %
Eosinophils Absolute: 0.1 K/uL (ref 0.0–0.5)
Eosinophils Relative: 1 %
HCT: 42.9 % (ref 36.0–46.0)
Hemoglobin: 15.2 g/dL — ABNORMAL HIGH (ref 12.0–15.0)
Immature Granulocytes: 0 %
Lymphocytes Relative: 35 %
Lymphs Abs: 3.1 K/uL (ref 0.7–4.0)
MCH: 31.3 pg (ref 26.0–34.0)
MCHC: 35.4 g/dL (ref 30.0–36.0)
MCV: 88.3 fL (ref 80.0–100.0)
Monocytes Absolute: 0.5 K/uL (ref 0.1–1.0)
Monocytes Relative: 6 %
Neutro Abs: 5.2 K/uL (ref 1.7–7.7)
Neutrophils Relative %: 58 %
Platelets: 262 K/uL (ref 150–400)
RBC: 4.86 MIL/uL (ref 3.87–5.11)
RDW: 12.1 % (ref 11.5–15.5)
WBC: 8.9 K/uL (ref 4.0–10.5)
nRBC: 0 % (ref 0.0–0.2)

## 2023-07-25 LAB — TSH: TSH: 2.034 u[IU]/mL (ref 0.350–4.500)

## 2023-07-25 LAB — LIPID PANEL
Cholesterol: 151 mg/dL (ref 0–200)
HDL: 62 mg/dL (ref >40)
LDL Cholesterol: 74 mg/dL (ref 0–99)
Total CHOL/HDL Ratio: 2.4 ratio
Triglycerides: 74 mg/dL (ref <150)
VLDL: 15 mg/dL (ref 0–40)

## 2023-07-25 LAB — CORTISOL: Cortisol, Plasma: 1.9 ug/dL

## 2023-07-25 LAB — VITAMIN D 25 HYDROXY (VIT D DEFICIENCY, FRACTURES): Vit D, 25-Hydroxy: 103.61 ng/mL — ABNORMAL HIGH (ref 30–100)

## 2023-07-25 MED ORDER — IOHEXOL 300 MG/ML  SOLN
100.0000 mL | Freq: Once | INTRAMUSCULAR | Status: AC | PRN
  Administered 2023-07-25: 100 mL via INTRAVENOUS

## 2023-09-15 ENCOUNTER — Other Ambulatory Visit: Payer: Self-pay

## 2023-10-20 ENCOUNTER — Other Ambulatory Visit: Payer: Self-pay

## 2023-10-21 ENCOUNTER — Other Ambulatory Visit: Payer: Self-pay

## 2023-11-07 DIAGNOSIS — D2271 Melanocytic nevi of right lower limb, including hip: Secondary | ICD-10-CM | POA: Diagnosis not present

## 2023-11-07 DIAGNOSIS — L821 Other seborrheic keratosis: Secondary | ICD-10-CM | POA: Diagnosis not present

## 2023-11-07 DIAGNOSIS — D2262 Melanocytic nevi of left upper limb, including shoulder: Secondary | ICD-10-CM | POA: Diagnosis not present

## 2023-11-07 DIAGNOSIS — D2272 Melanocytic nevi of left lower limb, including hip: Secondary | ICD-10-CM | POA: Diagnosis not present

## 2023-11-07 DIAGNOSIS — L814 Other melanin hyperpigmentation: Secondary | ICD-10-CM | POA: Diagnosis not present

## 2023-11-07 DIAGNOSIS — L578 Other skin changes due to chronic exposure to nonionizing radiation: Secondary | ICD-10-CM | POA: Diagnosis not present

## 2023-11-07 DIAGNOSIS — D225 Melanocytic nevi of trunk: Secondary | ICD-10-CM | POA: Diagnosis not present

## 2023-11-07 DIAGNOSIS — D2261 Melanocytic nevi of right upper limb, including shoulder: Secondary | ICD-10-CM | POA: Diagnosis not present

## 2023-11-07 DIAGNOSIS — D2361 Other benign neoplasm of skin of right upper limb, including shoulder: Secondary | ICD-10-CM | POA: Diagnosis not present

## 2023-12-12 ENCOUNTER — Encounter: Payer: Self-pay | Admitting: General Surgery

## 2024-01-12 ENCOUNTER — Other Ambulatory Visit: Payer: Self-pay

## 2024-01-12 MED ORDER — ESTRADIOL 1 MG PO TABS
1.0000 mg | ORAL_TABLET | Freq: Every day | ORAL | 3 refills | Status: AC
  Filled 2024-01-30: qty 90, 90d supply, fill #0

## 2024-01-30 ENCOUNTER — Other Ambulatory Visit: Payer: Self-pay

## 2024-02-03 ENCOUNTER — Other Ambulatory Visit: Payer: Self-pay

## 2024-02-03 MED ORDER — FLUCONAZOLE 150 MG PO TABS
ORAL_TABLET | ORAL | 1 refills | Status: AC
  Filled 2024-02-03: qty 1, 1d supply, fill #0
